# Patient Record
Sex: Male | Born: 1955 | Race: Black or African American | Hispanic: No | Marital: Married | Smoking: Never smoker
Health system: Southern US, Community
[De-identification: ages and names within clinical notes are randomized; demographics above are authoritative.]

## PROBLEM LIST (undated history)

## (undated) DIAGNOSIS — T7840XA Allergy, unspecified, initial encounter: Secondary | ICD-10-CM

## (undated) DIAGNOSIS — K222 Esophageal obstruction: Secondary | ICD-10-CM

## (undated) DIAGNOSIS — I10 Essential (primary) hypertension: Secondary | ICD-10-CM

## (undated) DIAGNOSIS — K922 Gastrointestinal hemorrhage, unspecified: Secondary | ICD-10-CM

## (undated) HISTORY — DX: Allergy, unspecified, initial encounter: T78.40XA

---

## 1981-05-22 DIAGNOSIS — K222 Esophageal obstruction: Secondary | ICD-10-CM | POA: Insufficient documentation

## 2000-05-25 ENCOUNTER — Ambulatory Visit (HOSPITAL_COMMUNITY): Admission: RE | Admit: 2000-05-25 | Discharge: 2000-05-25 | Payer: Self-pay | Admitting: Gastroenterology

## 2000-05-25 ENCOUNTER — Encounter: Payer: Self-pay | Admitting: Gastroenterology

## 2002-08-22 ENCOUNTER — Encounter: Payer: Self-pay | Admitting: Gastroenterology

## 2002-08-22 ENCOUNTER — Ambulatory Visit (HOSPITAL_COMMUNITY): Admission: RE | Admit: 2002-08-22 | Discharge: 2002-08-22 | Payer: Self-pay | Admitting: Gastroenterology

## 2003-06-25 ENCOUNTER — Ambulatory Visit (HOSPITAL_COMMUNITY): Admission: RE | Admit: 2003-06-25 | Discharge: 2003-06-25 | Payer: Self-pay | Admitting: Gastroenterology

## 2003-06-25 ENCOUNTER — Encounter (INDEPENDENT_AMBULATORY_CARE_PROVIDER_SITE_OTHER): Payer: Self-pay | Admitting: Specialist

## 2005-07-26 ENCOUNTER — Ambulatory Visit (HOSPITAL_COMMUNITY): Admission: RE | Admit: 2005-07-26 | Discharge: 2005-07-26 | Payer: Self-pay | Admitting: Gastroenterology

## 2007-04-17 ENCOUNTER — Encounter (INDEPENDENT_AMBULATORY_CARE_PROVIDER_SITE_OTHER): Payer: Self-pay | Admitting: Gastroenterology

## 2007-04-17 ENCOUNTER — Ambulatory Visit (HOSPITAL_COMMUNITY): Admission: RE | Admit: 2007-04-17 | Discharge: 2007-04-17 | Payer: Self-pay | Admitting: Gastroenterology

## 2008-07-01 ENCOUNTER — Ambulatory Visit (HOSPITAL_COMMUNITY): Admission: RE | Admit: 2008-07-01 | Discharge: 2008-07-01 | Payer: Self-pay | Admitting: Gastroenterology

## 2010-10-04 NOTE — Op Note (Signed)
NAME:  Kenneth Maynard, Kenneth Maynard            ACCOUNT NO.:  [id number]   MEDICAL RECORD NO.:  [id number]          PATIENT TYPE:  AMB   LOCATION:  ENDO                         FACILITY:  [facility]   PHYSICIAN:  Danise Edge, M.D.   DATE OF BIRTH:  Dec 30, 1955   DATE OF PROCEDURE:  07/01/2008  DATE OF DISCHARGE:                               OPERATIVE REPORT   PROCEDURES:  Esophagogastroduodenoscopy, Savary esophageal dilation, and  surveillance colonoscopy.   INDICATIONS FOR PROCEDURE:  [name] is a 55 year old male  born on Oct 13, 1955.  Kenneth Maynard has an unexplained cervical  esophageal stricture which has required multiple esophageal dilations in  the past.  Approximately 5 years ago, he underwent his first screening  colonoscopy and a 2-mm adenomatous polyp was removed from his colon.  His father was diagnosed with colon cancer at age 33.   MEDICATION ALLERGIES:  None.   CHRONIC MEDICATIONS:  1. Lisinopril.  2. Hydrochlorothiazide.  3. Aspirin 81 mg.   PAST MEDICAL AND SURGICAL HISTORY:  Uncomplicated hypertension.  Cervical esophageal stricture.   FAMILY HISTORY:  Father diagnosed with colon cancer at age 15.   HABITS:  [name] does not smoke cigarettes and consumes alcohol in  moderation.   PROCEDURE #1:  Esophagogastroduodenoscopy with Savary esophageal  dilation.  After obtaining informed consent, Kenneth Maynard was placed in  the left lateral decubitus position on the fluoroscopy table.  I  administered a total of fentanyl 125 mcg and Versed 12.5 mg for his  esophagogastroduodenoscopy and colonoscopy.  The patient's blood  pressure, oxygen saturation, and cardiac rhythm were monitored  throughout the procedure and documented in the medical record.   A Pentax gastroscope was passed into the cervical esophagus through a  normal-appearing hypopharynx.  I had a great deal of difficulty locating  the entrance to the esophagus through a very very tight cervical  esophageal stricture.  I could only get the guidewire through the  stricture and under fluoroscopic guidance, the Savary esophageal  guidewire was passed down the esophagus into the stomach and into the  distal gastric antrum.  Under fluoroscopic guidance, the 10-mm, 11-mm,  and 12-mm Savary dilators were passed with minimal resistance.  The  guidewire was removed.  The Pentax gastroscope was again passed through  the posterior hypopharynx into the proximal esophagus and through the  cervical esophageal stricture without difficulty.  The esophageal  stricture appeared to be adequately dilated and I did not plan to pass  any further dilators given the tightness of the cervical esophageal  stricture.  The mucosa in the mid to distal esophagus appeared normal.  At the esophagogastric junction was a dilated Schatzki ring.  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  There is no endoscopic evidence for the  presence of erosive esophagitis or Barrett esophagus.   GASTROSCOPY:  Retroflex view of the gastric cardia and fundus was  normal.  The gastric body, antrum, and pylorus appeared normal.   DUODENOSCOPY:  The duodenal bulb and descending duodenum appeared  normal.  Two years ago, biopsies were obtained from the esophagus which did not  reveal eosinophilic esophagitis.   ASSESSMENT:  Cervical esophageal stricture and Schatzki ring at the  esophagogastric junction dilated with a 10-mm, 11-mm, and 12-mm Savary  dilators.  Otherwise, esophagogastroduodenoscopy was normal except for  an unchanged subepithelial tumor in the distal esophagus.   PROCEDURE #2:  Surveillance colonoscopy.  Anal inspection and digital  rectal exam were normal.  The Pentax colonoscope was introduced into the  rectum and advanced to the cecum.  A normal-appearing ileocecal valve  and appendiceal orifice were identified.  Colonic preparation for the   exam today was good.   Rectum:  Normal.  Sigmoid colon and descending colon:  Normal.  Splenic flexure:  Normal.  Transverse colon:  Normal.  Hepatic flexure:  Normal.  Ascending colon:  Normal.  Cecum and ileocecal valve:  Normal.   ASSESSMENT:  Normal surveillance proctocolonoscopy to the cecum.   RECOMMENDATIONS:  Repeat colonoscopy in 5 years.           ______________________________  Danise Edge, M.D.     MJ/MEDQ  D:  07/01/2008  T:  07/01/2008  Job:  [id number]   cc:   Brett Canales A. Cleta Alberts, M.D.

## 2010-10-04 NOTE — Op Note (Signed)
NAME:  Kenneth Maynard, Kenneth Maynard            ACCOUNT NO.:  [id number]   MEDICAL RECORD NO.:  [id number]          PATIENT TYPE:  AMB   LOCATION:  ENDO                         FACILITY:  [facility]   PHYSICIAN:  Danise Edge, M.D.   DATE OF BIRTH:  1955-07-26   DATE OF PROCEDURE:  04/17/2007  DATE OF DISCHARGE:                               OPERATIVE REPORT   PROCEDURE:  Esophagogastroduodenoscopy, Savary esophageal dilation  report.   PROCEDURE INDICATIONS:  Kenneth Maynard is a 55 year old male born  January 18, 1956.  Kenneth Maynard was diagnosed with an unexplained cervical  esophageal stricture years ago by Dr. Kaylyn Lim at [facility].  Kenneth Maynard has required regular esophageal dilations to  manage his recurrent dysphagia.  The etiology of the cervical esophageal  stricture has not been established.  In addition Kenneth Maynard has a small  subepithelial lesion at the esophagogastric junction which has not  changed in size for the past 5 years that I have been following [name].   Kenneth Maynard has redeveloped esophageal dysphagia.   ENDOSCOPIST:  Danise Edge, M.D.   PREMEDICATION:  Fentanyl 75 mcg, Versed 10 mg.   PROCEDURE:  After obtaining informed consent Kenneth Maynard was placed in  the left lateral decubitus position.  I administered intravenous  fentanyl and intravenous Versed to achieve conscious sedation for the  procedure.  The patient's blood pressure, oxygen saturation and cardiac  rhythm were monitored throughout the procedure and documented in the  medical record.   The Pentax gastroscope was passed through the posterior hypopharynx into  the proximal esophagus.  I could identify a very tight stricture at  approximately 18 cm from the incisor teeth but could not pass the Pentax  gastroscope through the stricture.  The Savary dilator wire was passed  through the gastroscope and I could only advance the wire to the distal  esophagus but not into the  stomach.  The gastroscope was removed.   The 10-mm Savary dilator was passed under fluoroscopic guidance.  The  guidewire was removed.  The Pentax gastroscope was again passed through  the posterior hypopharynx and the proximal esophagus revealing a  partially dilated cervical esophageal stricture at 18 cm from the  incisor teeth.  I was able to traverse the stricture with the  gastroscope.  The remainder of the esophageal mucosa appears normal.  The subepithelial lesion at the esophagogastric junction measures  approximately 1 cm and has not changed in size over the past 6 years.  There is no endoscopic evidence for the presence of erosive esophagitis  or Barrett's esophagus.   GASTROSCOPY:  Retroflexed view of the gastric cardia and fundus was  normal.  The gastric body, antrum and pylorus appeared normal.   DUODENOSCOPY:  The duodenal bulb and descending duodenum appear normal.   SAVARY ESOPHAGEAL DILATION:  The Savary dilator wire was passed through  the gastroscope and the tip of the guidewire advanced to the distal  gastric antrum as confirmed endoscopically and fluoroscopically.  Under  fluoroscopic guidance the 12 mm, 14 mm, and 15-mm Savary dilators were  passed without resistance.  Repeat esophagogastrostomy confirmed  satisfactory dilation of the cervical esophageal stricture at  approximately 18 cm from the incisor teeth and no gastric trauma due to  the guidewire.   BIOPSIES:  Multiple biopsies were obtained along the length of the  esophagus to rule out eosinophilic esophagitis.   ASSESSMENT:  1. Tight cervical esophageal stricture at 18 cm from the incisor teeth      dilated with the 10 mm, 12 mm, 14 mm, and 15-mm Savary dilators.  2. Esophageal biopsies performed to rule out eosinophilic esophagitis.  3. Chronic subepithelial lesion at the esophagogastric junction      measuring approximately 1 cm in size which has not changed in the      last 6 years.            ______________________________  Danise Edge, M.D.     MJ/MEDQ  D:  04/17/2007  T:  04/17/2007  Job:  [id number]   cc:   Brett Canales A. Cleta Alberts, M.D.

## 2010-10-07 NOTE — Op Note (Signed)
NAME:  Kenneth, Maynard NO.:  [id number]   MEDICAL RECORD NO.:  [id number]          PATIENT TYPE:  AMB   LOCATION:  ENDO                         FACILITY:  [facility]   PHYSICIAN:  Danise Edge, M.D.   DATE OF BIRTH:  05/20/1956   DATE OF PROCEDURE:  07/26/2005  DATE OF DISCHARGE:                                 OPERATIVE REPORT   PROCEDURE INDICATIONS:  [name] is a 55 year old male who has a  chronic benign cervical esophageal stricture of undetermined etiology which  has required numerous esophageal dilations to manage his dysphagia. 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 His father was diagnosed with  colon cancer at age 91. On June 25, 2003, [name] underwent a  screening colonoscopy and a 2-mm sessile adenomatous polyp was removed from  his splenic flexure. He is due for a repeat colonoscopy in February 2010.   ENDOSCOPIST:  Danise Edge, M.D.   PREMEDICATION:  Versed 7.5 milligrams, Demerol 50 milligrams.   PROCEDURE:  After obtaining informed consent Mr. Pile was placed in the  left lateral decubitus position on the fluoroscopy table. I administered  intravenous Demerol and intravenous Versed to achieve conscious sedation for  the procedure. The patient's blood pressure, oxygen saturation and cardiac  rhythm were monitored throughout the procedure and documented in the medical  record.   The Olympus gastroscope was passed through the posterior hypopharynx into  the proximal esophagus at which point the cervical esophageal stricture did  not allow passage of the gastroscope distally. Under fluoroscopic guidance,  the Savary dilator wire was passed but I could only place the guidewire in  the distal esophagus as it would not traverse the esophagogastric junction  and enter the stomach. The gastroscope was removed. The 10-mm Savary dilator  was  passed under fluoroscopic guidance. The dilator wire was removed. The  Olympus gastroscope was passed through the posterior hypopharynx into the  proximal esophagus without difficulty. The hypopharynx, larynx and vocal  cords appeared normal.   Esophagoscopy: The cervical esophageal stricture was dilated and allowed  passage of the gastroscope down the esophagus to the esophagogastric  junction. The mid and distal esophagus appeared normal except for the  presence of a 1 cm submucosal polypoid tumor at the esophagogastric junction  with normal overlying esophageal mucosa. This lesion has been monitored over  the five years and has not changed.   Gastroscopy: Retroflex view of the gastric cardia and fundus was normal. The  gastric body, antrum and pylorus appeared normal.   Duodenoscopy: The duodenal bulb and descending duodenum appeared normal.   Savary esophageal dilation: The Savary dilator wire was passed through the  gastroscope and the tip of the guidewire advanced to the distal gastric  antrum as confirmed endoscopically and fluoroscopically. Under fluoroscopic  guidance the 12-mm and 14-mm Savary dilators were passed without resistance.  The guidewire was then removed and repeat esophagogastrostomy confirmed  satisfactory dilation of the benign stricture in the cervical esophagus and  no gastric trauma due to the guidewire.  ASSESSMENT:  1.  Benign cervical esophageal stricture dilated with the 10-mm, 12-mm, and      14-mm Savary dilators.  2.  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           ______________________________  Danise Edge, M.D.     MJ/MEDQ  D:  07/26/2005  T:  07/27/2005  Job:  [id number]

## 2010-10-07 NOTE — Procedures (Signed)
[facility]  Patient:    Kenneth Maynard, Kenneth Maynard                     MRN: [id number] Proc. Date: 05/25/00 Attending:  Verlin Maynard, M.D. CC:         Kenneth Maynard. Kenneth Maynard, M.D.  Kenneth C. Kenneth Maynard, M.D.   Procedure Report  REFERRING PHYSICIAN:  Viviann Spare A. Kenneth Maynard, M.D.  PROCEDURE INDICATIONS:  Kenneth Maynard has a chronic cervical esophageal stricture associated with dysphagia.  Approximately six years ago, he was evaluated at [facility] and underwent his first and only esophageal dilation performed by Kenneth Maynard, M.D.  Kenneth Maynard underwent a barium swallow x-ray at the [facility] facility on April 10, 2000, which revealed a proximal esophageal stricture.  Kenneth Maynard viewed our esophagogastroduodenoscopy education film.  I discussed with him the complications associated with esophageal dilation, including intestinal bleeding and intestinal perforation.  Kenneth Maynard has signed the operative permit.  ENDOSCOPIST:  Kenneth Maynard, M.D.  PREMEDICATION:  Demerol 100 mg, Versed 10 mg.  ENDOSCOPE:  Olympus gastroscope.  DESCRIPTION OF PROCEDURE:  After obtaining informed consent, the patient was placed in the left lateral decubitus position on the fluoroscopy table.  I administered intravenous Demerol and intravenous Versed to achieve conscious sedation for the procedure.  The patients blood pressure, oxygen saturation, and cardiac rhythm were monitored throughout the procedure and documented in the medical record.  The Olympus gastroscope was passed through the posterior hypopharynx into the proximal esophagus.  The hypopharynx, larynx, and vocal cords appeared normal.  Esophagoscopy:  There is a very tight cervical esophageal stricture.  I could not traverse the stricture with the 9 mm diameter gastroscope.  The Savary dilator wire was passed through the endoscope and under fluoroscopic guidance, the tip of  the guide wire was advanced into the proximal gastric body.  The 9 mm, 10 mm, 11 mm, and 12 mm Savary dilators were passed under fluoroscopic guidance and without resistance.  The guide wire was removed and the gastroscope was passed through the posterior hypopharynx into the proximal esophagus without difficulty.  I was able to traverse the proximal esophageal stricture and the mid and distal esophagus appeared normal.  There does appear to be a 1 cm submucosal, benign-appearing polyp in the distal esophagus, which is not obstructing the esophagus.  Gastroscopy:  Retroflexed view of the gastric cardia and fundus was normal. The gastric body, antrum, and pylorus appeared normal.  Duodenoscopy:  The duodenal bulb and descending duodenum appeared normal.  The Savary dilator wire was again passed through the endoscope and the tip of the guide wire advanced to the distal gastric antrum as confirmed both endoscopically and fluoroscopically.  Under fluoroscopic guidance the 12.8 mm and 14 mm Savary dilators passed with minimal resistance.  Repeat esophagogastroscopy confirmed satisfactory dilation of the apparent cervical esophageal stricture and no evidence of gastric trauma due to the guide wire.  ASSESSMENT:  Very high cervical esophageal stricture with an otherwise normal-appearing esophagogastroduodenoscopy, except for the presence of a 1 cm submucosal polyp in the distal esophagus with normal overlying gastric mucosa. The cervical esophageal stricture was dilated with the 9 mm, 10 mm, 11 mm, 12 mm, 12.8 mm, and 14 mm Savary dilators.  I am referring Kenneth Maynard to the radiology suite for a barium swallow x-ray to confirm that I did not cause an esophageal perforation with the procedure and to better define the cervical esophageal  stricture, which will probably require dilations in the future.  I have written him a prescription for Percocet 10/650 mg as an elixir (30 doses). DD:   05/25/00 TD:  05/25/00 Job: [id number]

## 2010-10-07 NOTE — Op Note (Signed)
NAME:  Kenneth Maynard, Kenneth Maynard                        ACCOUNT NO.:  [id number]   MEDICAL RECORD NO.:  [id number]                   PATIENT TYPE:  AMB   LOCATION:  ENDO                                 FACILITY:  [facility]   PHYSICIAN:  Danise Edge, M.D.                DATE OF BIRTH:  07/14/1955   DATE OF PROCEDURE:  08/22/2002  DATE OF DISCHARGE:  08/22/2002                                 OPERATIVE REPORT   PROCEDURE:  Esophageal dilation.   REFERRING PHYSICIAN:  Stan Head. Cleta Alberts, M.D.   INDICATIONS:  The patient is a 55 year old male born 08-24-2055.  The patient  has a chronic cervical esophageal stricture.  Approximately six years ago he  was evaluated at the [facility] and underwent an  esophageal dilation performed by Dr. Kaylyn Lim.   On 04/10/00, the patient underwent a barium swallow x-ray at [facility], which revealed a proximal esophageal stricture.   ENDOSCOPIST:  Danise Edge, M.D.   PREMEDICATION:  Demerol 100 mg, Versed 10 mg.   DESCRIPTION OF PROCEDURE:  After obtaining informed consent, the patient was  placed in the left lateral decubitus position on the fluoroscopy table.  I  administered intravenous Demerol and intravenous Versed to achieve conscious  sedation for the procedure.  The patient's, blood pressure, oxygen  saturation, and cardiac rhythm were monitored throughout the procedure and  documented in the medical record.   The Olympus gastroscope was passed through the posterior hypopharynx and  into the proximal esophagus, at which point a stricture was encountered.  Narrowing of the esophageal lumen was too tight to allow passage of the  endoscope.  The guidewire was passed through the endoscope and under  fluoroscopic guidance down the esophagus and into the distal stomach.  The  10 mm, 11 mm, and 12 mm Savary dilators were passed under fluoroscopic  guidance with minimal resistance.  The guidewire was removed.  The  Olympus  gastroscope was passed through the posterior hypopharynx into the proximal  esophagus without difficulty.  The hypopharynx, larynx, and vocal cords  appeared normal.   Esophagoscopy:  There is a thin-rimmed dilated proximal esophageal  stricture, which offered no resistance to the passage of the gastroscope  post dilation.  The mucosa in the proximal, mid-, and lower segments of the  esophagus appear normal.  The squamocolumnar junction and esophagogastric  junction are noted at 40 cm from the incisor teeth.  There is a 7-10 mm  submucosal lesion with overlying intact esophageal mucosa at the  esophagogastric junction.   Gastroscopy:  Retroflexed view of the gastric cardia and fundus was normal.  The gastric body, antrum, and pylorus appear normal.   Duodenoscopy:  The duodenal bulb and descending duodenum appear normal.    ASSESSMENT:  1. Cervical esophageal stricture, dilated with the 10 mm, 11 mm, and 12 mm     Savary dilators.  2. Submucosal lesion (  7-10 mm diameter) at the esophagogastric junction with     normal overlying esophageal mucosa.                                               Danise Edge, M.D.    MJ/MEDQ  D:  08/22/2002  T:  08/24/2002  Job:  [id number]   cc:   Brett Canales A. Cleta Alberts, M.D.  [address]  [place]  [place] [address]  Fax: [phone]

## 2010-10-07 NOTE — Op Note (Signed)
NAME:  Kenneth Maynard, APO                        ACCOUNT NO.:  [id number]   MEDICAL RECORD NO.:  [id number]                   PATIENT TYPE:  AMB   LOCATION:  ENDO                                 [facility]ACILITY:  [facility]   PHYSICIAN:  Danise Edge, M.D.                DATE OF BIRTH:  1955/10/20   DATE OF PROCEDURE:  06/25/2003  DATE OF DISCHARGE:                                 OPERATIVE REPORT   PROCEDURE:  Esophagogastroduodenoscopy, Savary esophageal dilation,  colonoscopy, and polypectomy report.   INDICATIONS:  Mr. Page Lancon is a 55 year old male with a chronic  cervical esophageal stricture, etiology undetermined, which has required  numerous esophageal dilations.  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  His father was diagnosed  with colon cancer at age 67.  Mr. Marques Ericson needs colorectal polyp  screening to prevent colon cancer.   ENDOSCOPIST:  Danise Edge, M.D.   PREMEDICATION:  Versed 10 mg, Demerol 100 mg.   DESCRIPTION OF PROCEDURE:  Esophageal dilation:  After obtaining informed  consent, Mr. Jaquith was placed on the left lateral decubitus position on the  fluoroscopy table.  I administered intravenous Demerol and intravenous  Versed to achieve conscious sedation for the procedure.  The patient's blood  pressure, oxygen saturation and cardiac rhythm were monitored throughout the  procedure and documented in the medical record.   The Olympus gastroscope was passed through the posterior hypopharynx into  the proximal esophagus but could not be advanced down the length of the  esophagus due to the presence of the cervical esophageal stricture.  The  Savary dilator wire was passed through the endoscope and down to distal  esophagus under fluoroscopic guidance.  In the distal esophagus, the Savary  dilator wire curled and could not be advanced into the stomach.  The 9 mm,  10 mm and 11 mm Savary dilators were  passed under fluoroscopic guidance to  the distal esophagus.  The Savary dilator wire was removed.  The Olympus  gastroscope was passed through the posterior hypopharynx and into the  proximal esophagus without difficulty.  The hypopharynx, larynx and vocal  cords appeared normal.   Esophagoscopy:  There is a partially dilated stricture in the cervical  esophagus which is benign.  The submucosal tumor is noted just proximal to  the esophagogastric junction.  There appears to be a shallow Schatzki's ring  at the esophagogastric junction.  The mucosa of the esophagus is normal.   Gastroscopy:  Retroflexed view of the gastrocardia and fundus reveals a  patulous diaphragmatic hiatus with normal gastric cardia and fundus.  The  gastric body, antrum and pylorus appeared normal.   Duodenoscopy:  The duodenal bulb and descending duodenum appeared normal.   ASSESSMENT:  1. Cervical esophageal stricture dilated with the 9 mm, 10 mm and 11 mm     Savary dilators.  2. Chronically unchanging submucosal polypoid  tumor in the distal esophagus     with overlying normal esophageal mucosa.  3. Patulous diaphragmatic hiatus with shallow Schatzki's ring at the     esophagogastric junction.   PROCEDURE:  Colonoscopy with polypectomy.   Anal inspection was normal.  Digital rectal exam was normal.  The Olympus  adjustable pediatric  colonoscope was introduced into the rectum and  advanced to the cecum.  Colonic preparation for the exam today was  excellent.  Rectum:  Normal.  Sigmoid colon and descending colon:  Normal.  Splenic flexure:  A 2 mm sessile polyp was removed from the splenic flexure  with the electrocautery snare and submitted for pathological interpretation.  Transverse colon:  Normal.  Hepatic flexure:  Normal.  Ascending colon:  Normal.  Cecum and ileocecal valve:  Normal.   ASSESSMENT:  1. Positive family history for colon cancer in his father diagnosed at age     43.  2. Small  polyp removed from the splenic flexure with the electrocautery     snare.   RECOMMENDATIONS:  Repeat colonoscopy in five years.                                               Danise Edge, M.D.    MJ/MEDQ  D:  06/25/2003  T:  06/25/2003  Job:  [id number]

## 2011-04-24 ENCOUNTER — Encounter: Payer: Self-pay | Admitting: Physician Assistant

## 2011-05-04 ENCOUNTER — Ambulatory Visit (INDEPENDENT_AMBULATORY_CARE_PROVIDER_SITE_OTHER): Payer: 59

## 2011-05-04 DIAGNOSIS — R937 Abnormal findings on diagnostic imaging of other parts of musculoskeletal system: Secondary | ICD-10-CM

## 2011-05-04 DIAGNOSIS — J301 Allergic rhinitis due to pollen: Secondary | ICD-10-CM

## 2011-06-22 ENCOUNTER — Telehealth: Payer: Self-pay | Admitting: Physician Assistant

## 2011-06-22 DIAGNOSIS — G4700 Insomnia, unspecified: Secondary | ICD-10-CM

## 2011-06-22 MED ORDER — CLONAZEPAM 0.5 MG PO TABS: 0.5000 mg | ORAL_TABLET | Freq: Every evening | ORAL | Status: DC | PRN

## 2011-06-22 NOTE — Telephone Encounter (Signed)
Fax request from pharmacy for Clonazepam. Please call in. csj

## 2011-06-22 NOTE — Telephone Encounter (Signed)
Clonazapam called in and left on VM.

## 2011-06-25 ENCOUNTER — Encounter: Payer: Self-pay | Admitting: Physician Assistant

## 2011-06-25 NOTE — Telephone Encounter (Signed)
Error

## 2011-09-09 ENCOUNTER — Telehealth: Payer: Self-pay | Admitting: Physician Assistant

## 2011-09-09 MED ORDER — CLONAZEPAM 0.5 MG PO TABS: 0.5000 mg | ORAL_TABLET | Freq: Every evening | ORAL | Status: DC | PRN

## 2011-09-09 NOTE — Telephone Encounter (Signed)
Rx printed

## 2011-09-19 ENCOUNTER — Telehealth: Payer: Self-pay

## 2011-09-19 NOTE — Telephone Encounter (Signed)
[facility] REQUESTS PTS LAST CPE FAXED TO THEM. PT HAS APPT 10/23/11.   [facility] PH: [phone] [facility] FAX: [phone]  BF

## 2011-10-19 ENCOUNTER — Encounter (HOSPITAL_COMMUNITY)
Admission: RE | Disposition: A | Discharge status: Home / Self Care | Payer: Self-pay | Source: Ambulatory Visit | Attending: Gastroenterology

## 2011-10-19 ENCOUNTER — Encounter (HOSPITAL_COMMUNITY): Payer: Self-pay

## 2011-10-19 ENCOUNTER — Ambulatory Visit (HOSPITAL_COMMUNITY)
Admission: RE | Admit: 2011-10-19 | Discharge: 2011-10-19 | Disposition: A | Discharge status: Home / Self Care | Payer: 59 | Source: Ambulatory Visit | Attending: Gastroenterology | Admitting: Gastroenterology

## 2011-10-19 ENCOUNTER — Ambulatory Visit (HOSPITAL_COMMUNITY): Payer: 59

## 2011-10-19 DIAGNOSIS — Z800 Family history of malignant neoplasm of digestive organs: Secondary | ICD-10-CM | POA: Insufficient documentation

## 2011-10-19 DIAGNOSIS — Z860100 Personal history of colon polyps, unspecified: Secondary | ICD-10-CM | POA: Insufficient documentation

## 2011-10-19 DIAGNOSIS — K222 Esophageal obstruction: Secondary | ICD-10-CM | POA: Insufficient documentation

## 2011-10-19 HISTORY — PX: SAVORY DILATION: SHX5439

## 2011-10-19 HISTORY — PX: ESOPHAGOGASTRODUODENOSCOPY: SHX5428

## 2011-10-19 HISTORY — DX: Essential (primary) hypertension: I10

## 2011-10-19 SURGERY — EGD (ESOPHAGOGASTRODUODENOSCOPY)
Anesthesia: Moderate Sedation

## 2011-10-19 MED ORDER — DIPHENHYDRAMINE HCL 50 MG/ML IJ SOLN
INTRAMUSCULAR | Status: AC
  Filled 2011-10-19: qty 1

## 2011-10-19 MED ORDER — MIDAZOLAM HCL 10 MG/2ML IJ SOLN
INTRAMUSCULAR | Status: AC
  Filled 2011-10-19: qty 4

## 2011-10-19 MED ORDER — FENTANYL NICU IV SYRINGE 50 MCG/ML
INJECTION | INTRAMUSCULAR | Status: DC | PRN
  Administered 2011-10-19 (×2): 25 ug via INTRAVENOUS
  Administered 2011-10-19: 50 ug via INTRAVENOUS

## 2011-10-19 MED ORDER — MIDAZOLAM HCL 10 MG/2ML IJ SOLN
INTRAMUSCULAR | Status: DC | PRN
  Administered 2011-10-19 (×4): 2.5 mg via INTRAVENOUS

## 2011-10-19 MED ORDER — BUTAMBEN-TETRACAINE-BENZOCAINE 2-2-14 % EX AERO
INHALATION_SPRAY | CUTANEOUS | Status: DC | PRN
  Administered 2011-10-19: 2 via TOPICAL

## 2011-10-19 MED ORDER — FENTANYL CITRATE 0.05 MG/ML IJ SOLN
INTRAMUSCULAR | Status: AC
  Filled 2011-10-19: qty 4

## 2011-10-19 MED ORDER — SODIUM CHLORIDE 0.9 % IV SOLN
Freq: Once | INTRAVENOUS | Status: AC
  Administered 2011-10-19: 500 mL via INTRAVENOUS

## 2011-10-19 NOTE — H&P (Signed)
Problem: Dysphagia associated with a cervical esophageal stricture.  History: The patient is a 56 year old male with a chronic cervical esophageal stricture which has required esophageal dilations in the past. Esophageal biopsies did not show eosinophilic esophagitis. The patient is scheduled to undergo a diagnostic esophagogastroduodenoscopy with dilation of his esophageal stricture.  Chronic medications: None  Medication allergies: None  Past medical and surgical history: Benign cervical esophageal stricture of undetermined etiology. History of adenomatous colon polyps. Normal surveillance colonoscopy February 2010.  Family history: Father diagnosed with colon cancer at age  Habits: The patient has never smoked cigarettes and does not consume alcohol.  Plan: Proceed with diagnostic esophagogastroduodenoscopy and dilation of the patient's cervical esophageal stricture.

## 2011-10-19 NOTE — Discharge Instructions (Signed)
Endoscopy Care After Please read the instructions outlined below and refer to this sheet in the next few weeks. These discharge instructions provide you with general information on caring for yourself after you leave the hospital. Your doctor may also give you specific instructions. While your treatment has been planned according to the most current medical practices available, unavoidable complications occasionally occur. If you have any problems or questions after discharge, please call your doctor. HOME CARE INSTRUCTIONS Activity  You may resume your regular activity but move at a slower pace for the next 24 hours.   Take frequent rest periods for the next 24 hours.   Walking will help expel (get rid of) the air and reduce the bloated feeling in your abdomen.   No driving for 24 hours (because of the anesthesia (medicine) used during the test).   You may shower.   Do not sign any important legal documents or operate any machinery for 24 hours (because of the anesthesia used during the test).  Nutrition  Drink plenty of fluids.   You may resume your normal diet.   Begin with a light meal and progress to your normal diet.   Avoid alcoholic beverages for 24 hours or as instructed by your caregiver.  Medications You may resume your normal medications unless your caregiver tells you otherwise. What you can expect today  You may experience abdominal discomfort such as a feeling of fullness or "gas" pains.   You may experience a sore throat for 2 to 3 days. This is normal. Gargling with salt water may help this.  Follow-up Your doctor will discuss the results of your test with you. SEEK IMMEDIATE MEDICAL CARE IF:  You have excessive nausea (feeling sick to your stomach) and/or vomiting.   You have severe abdominal pain and distention (swelling).   You have trouble swallowing.   You have a temperature over 100 F (37.8 C).   You have rectal bleeding or vomiting of blood.    Document Released: 12/21/2003 Document Revised: 04/27/2011 Document Reviewed: 07/03/2007 [employer] Patient Information 2012 [employer]. 

## 2011-10-19 NOTE — Op Note (Signed)
Procedure: Diagnostic esophagogastroduodenoscopy. Savary dilation of a benign cervical esophageal stricture. The balloon dilation of a benign esophageal stricture at the esophagogastric junction.  Endoscopist: Danise Edge  Premedication: Versed 10 mg. Fentanyl 100 mcg.  Procedure: The patient was placed in the left lateral decubitus position. The Pentax gastroscope was passed through the posterior hypopharynx into the proximal esophagus. At approximately 20 cm from the incisor teeth there is a benign cervical esophageal stricture. I was unable to traverse the stricture with the gastroscope. A Savary dilator wire was passed through the gastroscope and under fluoroscopic guidance, the tip of the guidewire was advanced to the distal gastric antrum. Under fluoroscopic guidance, the 10 mm, 11 mm, and 12 mm Savary dilators were passed without resistance. The Savary guidewire was removed. The Pentax gastroscope was passed through the posterior hypopharynx into the proximal esophagus without difficulty. The cervical esophageal stricture was dilated and there was a mucosal tear in the area of the stricture. There was no significant bleeding. The mid esophageal mucosa appeared normal. There is a benign stricture at the esophagogastric junction noted at approximately 40 cm from the incisor teeth. Just above the benign stricture at the esophagogastric junction there is a 1 cm subepithelial tumor which has not changed in the past few years. Using the esophageal balloon dilator, the benign stricture at the esophagogastric junction was dilated to 15 cm. There is no endoscopic evidence for the presence of erosive esophagitis, Barrett's esophagus, or malignancy .Previous biopsies of the esophagus did not show eosinophilic esophagitis.  Gastroscopy: Retroflex view of the gastric cardia and fundus was normal. The gastric body, antrum, and pylorus appeared normal.  Duodenoscopy: The duodenal bulb and descending duodenum  appeared normal.  Assessment: Benign cervical esophageal stricture dilated to 12 mm with the Savary dilator. A benign stricture at the esophagogastric junction dilated to 15 cm using the balloon dilator. 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

## 2011-10-20 ENCOUNTER — Encounter (HOSPITAL_COMMUNITY): Payer: Self-pay | Admitting: Gastroenterology

## 2011-11-04 ENCOUNTER — Other Ambulatory Visit: Payer: Self-pay | Admitting: *Deleted

## 2011-11-04 MED ORDER — CLONAZEPAM 0.5 MG PO TABS: 0.5000 mg | ORAL_TABLET | Freq: Every evening | ORAL | Status: DC | PRN

## 2011-11-05 ENCOUNTER — Other Ambulatory Visit: Payer: Self-pay | Admitting: *Deleted

## 2011-11-05 MED ORDER — CLONAZEPAM 0.5 MG PO TABS: 0.5000 mg | ORAL_TABLET | Freq: Every evening | ORAL | Status: DC | PRN

## 2011-12-11 ENCOUNTER — Ambulatory Visit (INDEPENDENT_AMBULATORY_CARE_PROVIDER_SITE_OTHER): Payer: 59 | Admitting: Internal Medicine

## 2011-12-11 ENCOUNTER — Encounter: Payer: Self-pay | Admitting: Internal Medicine

## 2011-12-11 VITALS — BP 120/82 | HR 82 | Temp 98.1°F | Resp 16 | Ht 65.0 in | Wt 146.0 lb

## 2011-12-11 DIAGNOSIS — I10 Essential (primary) hypertension: Secondary | ICD-10-CM

## 2011-12-11 DIAGNOSIS — F419 Anxiety disorder, unspecified: Secondary | ICD-10-CM | POA: Insufficient documentation

## 2011-12-11 DIAGNOSIS — Z79899 Other long term (current) drug therapy: Secondary | ICD-10-CM

## 2011-12-11 DIAGNOSIS — G4700 Insomnia, unspecified: Secondary | ICD-10-CM

## 2011-12-11 LAB — BASIC METABOLIC PANEL
CO2: 29 mEq/L (ref 19–32)
Chloride: 104 mEq/L (ref 96–112)
Sodium: 141 mEq/L (ref 135–145)

## 2011-12-11 NOTE — Progress Notes (Signed)
  Subjective:    Patient ID: Kenneth Maynard, male    DOB: 1955/12/11, 56 y.o.   MRN: [id number]  HPI Doing well HTN controlled  Family situation improving, back together again.   Review of Systems unchanged    Objective:   Physical Exam normal       Assessment & Plan:  RF meds 1 yr.

## 2012-03-21 ENCOUNTER — Encounter: Payer: Self-pay | Admitting: *Deleted

## 2012-05-21 ENCOUNTER — Other Ambulatory Visit: Payer: Self-pay | Admitting: Physician Assistant

## 2013-01-06 ENCOUNTER — Other Ambulatory Visit: Payer: Self-pay | Admitting: Physician Assistant

## 2013-03-10 ENCOUNTER — Other Ambulatory Visit: Payer: Self-pay | Admitting: Physician Assistant

## 2013-03-28 ENCOUNTER — Telehealth: Payer: Self-pay

## 2013-03-28 MED ORDER — LISINOPRIL-HYDROCHLOROTHIAZIDE 10-12.5 MG PO TABS: 1.0000 | ORAL_TABLET | Freq: Every day | ORAL | Status: DC

## 2013-03-28 NOTE — Telephone Encounter (Signed)
Clinical - patient made an appt for a CPE on Dec. 19 but needs refills of his Lisinipril HCT to hold him until his appt. He uses [facility] on [address].  Patient # to call [phone] private line and he said you could leave a detailed mssg.

## 2013-03-28 NOTE — Telephone Encounter (Signed)
LMOM that RFs were sent in. 

## 2013-03-28 NOTE — Telephone Encounter (Signed)
Meds ordered this encounter  Medications  . lisinopril-hydrochlorothiazide (PRINZIDE,ZESTORETIC) 10-12.5 MG per tablet    Sig: Take 1 tablet by mouth daily. Needs office visit/labs 05/09/2013    Dispense:  30 tablet    Refill:  1    Order Specific Question:  Supervising Provider    Answer:  Merla Riches, ROBERT P [[id number]]

## 2013-05-09 ENCOUNTER — Encounter: Payer: Self-pay | Admitting: Family Medicine

## 2013-05-09 ENCOUNTER — Ambulatory Visit (INDEPENDENT_AMBULATORY_CARE_PROVIDER_SITE_OTHER): Payer: 59 | Admitting: Family Medicine

## 2013-05-09 VITALS — BP 108/72 | HR 86 | Temp 98.6°F | Resp 16 | Ht 64.75 in | Wt 148.0 lb

## 2013-05-09 DIAGNOSIS — Z0000 Encounter for general adult medical examination without abnormal findings: Secondary | ICD-10-CM

## 2013-05-09 DIAGNOSIS — G4700 Insomnia, unspecified: Secondary | ICD-10-CM

## 2013-05-09 DIAGNOSIS — I10 Essential (primary) hypertension: Secondary | ICD-10-CM

## 2013-05-09 DIAGNOSIS — Z23 Encounter for immunization: Secondary | ICD-10-CM

## 2013-05-09 LAB — CBC WITH DIFFERENTIAL/PLATELET
HCT: 42.2 % (ref 39.0–52.0)
Hemoglobin: 14.4 g/dL (ref 13.0–17.0)
Lymphs Abs: 1.1 10*3/uL (ref 0.7–4.0)
MCHC: 34.1 g/dL (ref 30.0–36.0)
Monocytes Absolute: 0.6 10*3/uL (ref 0.1–1.0)
Monocytes Relative: 12 % (ref 3–12)
Neutro Abs: 2.8 10*3/uL (ref 1.7–7.7)
Neutrophils Relative %: 63 % (ref 43–77)
RBC: 4.63 MIL/uL (ref 4.22–5.81)

## 2013-05-09 LAB — COMPREHENSIVE METABOLIC PANEL
AST: 16 U/L (ref 0–37)
Albumin: 4.2 g/dL (ref 3.5–5.2)
Alkaline Phosphatase: 62 U/L (ref 39–117)
BUN: 11 mg/dL (ref 6–23)
Calcium: 9.5 mg/dL (ref 8.4–10.5)
Chloride: 102 mEq/L (ref 96–112)
Creat: 0.97 mg/dL (ref 0.50–1.35)
Glucose, Bld: 84 mg/dL (ref 70–99)
Potassium: 4.1 mEq/L (ref 3.5–5.3)

## 2013-05-09 LAB — LIPID PANEL
HDL: 65 mg/dL (ref >39)
LDL Cholesterol: 127 mg/dL — ABNORMAL HIGH (ref 0–99)
Total CHOL/HDL Ratio: 3.2 Ratio
Triglycerides: 79 mg/dL (ref <150)

## 2013-05-09 MED ORDER — LISINOPRIL-HYDROCHLOROTHIAZIDE 10-12.5 MG PO TABS: 1.0000 | ORAL_TABLET | Freq: Every day | ORAL | Status: DC

## 2013-05-09 MED ORDER — CETIRIZINE HCL 10 MG PO TABS: 10.0000 mg | ORAL_TABLET | Freq: Every day | ORAL | Status: DC

## 2013-05-09 MED ORDER — CLONAZEPAM 0.5 MG PO TABS: 0.5000 mg | ORAL_TABLET | Freq: Every evening | ORAL | Status: DC | PRN

## 2013-05-09 MED ORDER — CLONAZEPAM 0.5 MG PO TABS: 0.5000 mg | ORAL_TABLET | Freq: Two times a day (BID) | ORAL | Status: DC | PRN
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
## 2013-05-09 NOTE — Patient Instructions (Addendum)
Potassium Content of Foods Potassium is a mineral found in many foods and drinks. It helps keep fluids and minerals balanced in your body and also affects how steadily your heart beats. The body needs potassium to control blood pressure and to keep the muscles and nervous system healthy. However, certain health conditions and medicine may require you to eat more or less potassium-rich foods and drinks. Your caregiver or dietitian will tell you how much potassium you should have each day. COMMON SERVING SIZES The list below tells you how big or small common portion sizes are:  1 oz.........4 stacked dice.  3 oz.........Deck of cards.  1 tsp........Tip of little finger.  1 tbsp......Thumb.  2 tbsp......Golf ball.   c...........Half of a fist.  1 c............A fist. FOODS AND DRINKS HIGH IN POTASSIUM More than 200 mg of potassium per serving. A serving size is  c (120 mL or noted gram weight) unless otherwise stated. While all the items on this list are high in potassium, some items are higher in potassium than others. Fruits  Apricots (sliced), 83 g.  Apricots (dried halves), 3 oz / 24 g.  Avocado (cubed),  c / 50 g.  Banana (sliced), 75 g.  Cantaloupe (cubed), 80 g.  Dates (pitted), 5 whole / 35 g.  Figs (dried), 4 whole / 32 g.  Guava, c / 55 g.  Honeydew, 1 wedge / 85 g.  Kiwi (sliced), 90 g.  Nectarine, 1 small / 129 g.  Orange, 1 medium / 131 g.  Orange juice.  Pomegranate seeds, 87 g.  Pomegranate juice.  Prunes (pitted), 3 whole / 30 g.  Prune juice, 3 oz / 90 mL.  Seedless raisins, 3 tbsp / 27 g. Vegetables  Artichoke,  of a medium / 64 g.  Asparagus (boiled), 90 g.  Baked beans,  c / 63 g.  Bamboo shoots,  c / 38 g.  Beets (cooked slices), 85 g.  Broccoli (boiled), 78 g.  Brussels sprout (boiled), 78 g.  Butternut squash (baked), 103 g.  Chickpea (cooked), 82 g.  Green peas (cooked), 80 g.  Hubbard squash (baked cubes),  c /  68 g.  Kidney beans (cooked), 5 tbsp / 55 g.  Lima beans (cooked),  c / 43 g.  Navy beans (cooked),  c / 61 g.  Potato (baked), 61 g.  Potato (boiled), 78 g.  Pumpkin (boiled), 123 g.  Refried beans,  c / 79 g.  Spinach (cooked),  c / 45 g.  Split peas (cooked),  c / 65 g.  Sun-dried tomatoes, 2 tbsp / 7 g.  Sweet potato (baked),  c / 50 g.  Tomato (chopped or sliced), 90 g.  Tomato juice.  Tomato paste, 4 tsp / 21 g.  Tomato sauce,  c / 61 g.  Vegetable juice.  White mushrooms (cooked), 78 g.  Yam (cooked or baked),  c / 34 g.  Zucchini squash (boiled), 90 g. Other Foods and Drinks  Almonds (whole),  c / 36 g.  Cashews (oil roasted),  c / 32 g.  Chocolate milk.  Chocolate pudding, 142 g.  Clams (steamed), 1.5 oz / 43 g.  Dark chocolate, 1.5 oz / 42 g.  Fish, 3 oz / 85 g.  King crab (steamed), 3 oz / 85 g.  Lobster (steamed), 4 oz / 113 g.  Milk (skim, 1%, 2%, whole), 1 c / 240 mL.  Milk chocolate, 2.3 oz / 66 g.  Milk shake.  Nonfat fruit   variety yogurt, 123 g.  Peanuts (oil roasted), 1 oz / 28 g.  Peanut butter, 2 tbsp / 32 g.  Pistachio nuts, 1 oz / 28 g.  Pumpkin seeds, 1 oz / 28 g.  Red meat (broiled, cooked, grilled), 3 oz / 85 g.  Scallops (steamed), 3 oz / 85 g.  Shredded wheat cereal (dry), 3 oblong biscuits / 75 g.  Spaghetti sauce,  c / 66 g.  Sunflower seeds (dry roasted), 1 oz / 28 g.  Veggie burger, 1 patty / 70 g. FOODS MODERATE IN POTASSIUM Between 150 mg and 200 mg per serving. A serving is  c (120 mL or noted gram weight) unless otherwise stated. Fruits  Grapefruit,  of the fruit / 123 g.  Grapefruit juice.  Pineapple juice.  Plums (sliced), 83 g.  Tangerine, 1 large / 120 g. Vegetables  Carrots (boiled), 78 g.  Carrots (sliced), 61 g.  Rhubarb (cooked with sugar), 120 g.  Rutabaga (cooked), 120 g.  Sweet corn (cooked), 75 g.  Yellow snap beans (cooked), 63 g. Other Foods and  Drinks   Bagel, 1 bagel / 98 g.  Chicken breast (roasted and chopped),  c / 70 g.  Chocolate ice cream / 66 g.  Pita bread, 1 large / 64 g.  Shrimp (steamed), 4 oz / 113 g.  Swiss cheese (diced), 70 g.  Vanilla ice cream, 66 g.  Vanilla pudding, 140 g. FOODS LOW IN POTASSIUM Less than 150 mg per serving. A serving size is  cup (120 mL or noted gram weight) unless otherwise stated. If you eat more than 1 serving of a food low in potassium, the food may be considered a food high in potassium. Fruits  Apple (slices), 55 g.  Apple juice.  Applesauce, 122 g.  Blackberries, 72 g.  Blueberries, 74 g.  Cranberries, 50 g.  Cranberry juice.  Fruit cocktail, 119 g.  Fruit punch.  Grapes, 46 g.  Grape juice.  Mandarin oranges (canned), 126 g.  Peach (slices), 77 g.  Pineapple (chunks), 83 g.  Raspberries, 62 g.  Red cherries (without pits), 78 g.  Strawberries (sliced), 83 g.  Watermelon (diced), 76 g. Vegetables  Alfalfa sprouts, 17 g.  Bell peppers (sliced), 46 g.  Cabbage (shredded), 35 g.  Cauliflower (boiled), 62 g.  Celery, 51 g.  Collard greens (boiled), 95 g.  Cucumber (sliced), 52 g.  Eggplant (cubed), 41 g.  Green beans (boiled), 63 g.  Lettuce (shredded), 1 c / 36 g.  Onions (sauteed), 44 g.  Radishes (sliced), 58 g.  Spaghetti squash, 51 g. Other Foods and Drinks  Angel food cake, 1 slice / 28 g.  Black tea.  Brown rice (cooked), 98 g.  Butter croissant, 1 medium / 57 g.  Carbonated soda.  Coffee.  Cheddar cheese (diced), 66 g.  Corn flake cereal (dry), 14 g.  Cottage cheese, 118 g.  Cream of rice cereal (cooked), 122 g.  Cream of wheat cereal (cooked), 126 g.  Crisped rice cereal (dry), 14 g.  Egg (boiled, fried, poached, omelet, scrambled), 1 large / 46 61 g.  English muffin, 1 muffin / 57 g.  Frozen ice pop, 1 pop / 55 g.  Graham cracker, 1 large rectangular cracker / 14 g.  Jelly beans, 112  g.  Non-dairy whipped topping.  Oatmeal, 88 g.  Orange sherbet, 74 g.  Puffed rice cereal (dry), 7 g.  Pasta (cooked), 70 g.  Rice cakes, 4 cakes / 36   g.  Sugared doughnut, 4 oz / 116 g.  White bread, 1 slice / 30 g.  White rice (cooked), 79 93 g.  Wild rice (cooked), 82 g.  Yellow cake, 1 slice / 68 g. Document Released: 12/20/2004 Document Revised: 04/24/2012 Document Reviewed: 09/22/2011 [facility] Patient Information 2014 [place], [place].   Keeping you healthy  Get these tests  Blood pressure- Have your blood pressure checked once a year by your healthcare provider.  Normal blood pressure is 120/80  Weight- Have your body mass index (BMI) calculated to screen for obesity.  BMI is a measure of body fat based on height and weight. You can also calculate your own BMI at [email or web address].  Cholesterol- Have your cholesterol checked every year.  Diabetes- Have your blood sugar checked regularly if you have high blood pressure, high cholesterol, have a family history of diabetes or if you are overweight.  Screening for Colon Cancer- Colonoscopy starting at age 32.  Screening may begin sooner depending on your family history and other health conditions. Follow up colonoscopy as directed by your Gastroenterologist.  Screening for Prostate Cancer- Both blood work (PSA) and a rectal exam help screen for Prostate Cancer.  Screening begins at age 20 with African-American men and at age 83 with Caucasian men.  Screening may begin sooner depending on your family history.  Take these medicines  Aspirin- One aspirin daily can help prevent Heart disease and Stroke.  Flu shot- Every fall.  Tetanus- Every 10 years.  Zostavax- Once after the age of 29 to prevent Shingles.  Pneumonia shot- Once after the age of 56; if you are younger than 33, ask your healthcare provider if you need a Pneumonia shot.  Take these steps  Don't smoke- If you do smoke, talk to your doctor  about quitting.  For tips on how to quit, go to www.smokefree.gov or call 1-800-QUIT-NOW.  Be physically active- Exercise 5 days a week for at least 30 minutes.  If you are not already physically active start slow and gradually work up to 30 minutes of moderate physical activity.  Examples of moderate activity include walking briskly, mowing the yard, dancing, swimming, bicycling, etc.  Eat a healthy diet- Eat a variety of healthy food such as fruits, vegetables, low fat milk, low fat cheese, yogurt, lean meant, poultry, fish, beans, tofu, etc. For more information go to www.thenutritionsource.org  Drink alcohol in moderation- Limit alcohol intake to less than two drinks a day. Never drink and drive.  Dentist- Brush and floss twice daily; visit your dentist twice a year.  Depression- Your emotional health is as important as your physical health. If you're feeling down, or losing interest in things you would normally enjoy please talk to your healthcare provider.  Eye exam- Visit your eye doctor every year.  Safe sex- If you may be exposed to a sexually transmitted infection, use a condom.  Seat belts- Seat belts can save your life; always wear one.  Smoke/Carbon Monoxide detectors- These detectors need to be installed on the appropriate level of your home.  Replace batteries at least once a year.  Skin cancer- When out in the sun, cover up and use sunscreen 15 SPF or higher.  Violence- If anyone is threatening you, please tell your healthcare provider.  Living Will/ Health care power of attorney- Speak with your healthcare provider and family.

## 2013-05-10 LAB — TSH: TSH: 1.164 u[IU]/mL (ref 0.350–4.500)

## 2013-05-10 LAB — PSA: PSA: 1 ng/mL (ref <=4.00)

## 2013-05-11 ENCOUNTER — Encounter: Payer: Self-pay | Admitting: Family Medicine

## 2013-06-19 ENCOUNTER — Encounter: Payer: Self-pay | Admitting: Internal Medicine

## 2013-08-08 ENCOUNTER — Other Ambulatory Visit: Payer: Self-pay

## 2013-08-08 NOTE — Telephone Encounter (Signed)
[facility] faxed req for Rx for clonazepam. Check local pharm and pt still has 4 RFs from last Rx written 05/09/13 for #20 w/5RFs, which we can cancel when this Rx sent if approved. Pt verified that he would like the Rx sent to [facility] d/t being cheaper IF it can be sent for 60 or 90 day supply. There is no benefit if sent for 30 day and would want it left locally if we can only write for 30 day. Pended for #60.

## 2013-08-08 NOTE — Telephone Encounter (Signed)
I cannot write for more than a 30 day supply due to [place] prescribing laws in [place].  I do no think any of our providers typically write for more than a 30 day supply due the nature of controlled substances, risk of abuse, etc.

## 2013-08-11 NOTE — Telephone Encounter (Signed)
Notified pt left at local pharm d/t not being able to Rx more than 30 day at a time.

## 2013-11-07 ENCOUNTER — Ambulatory Visit: Payer: 59 | Admitting: Family Medicine

## 2014-02-13 ENCOUNTER — Other Ambulatory Visit: Payer: Self-pay | Admitting: Gastroenterology

## 2014-02-13 ENCOUNTER — Encounter (HOSPITAL_COMMUNITY): Payer: Self-pay | Admitting: *Deleted

## 2014-02-13 ENCOUNTER — Encounter (HOSPITAL_COMMUNITY): Payer: Self-pay | Admitting: Pharmacy Technician

## 2014-02-18 ENCOUNTER — Other Ambulatory Visit: Payer: Self-pay | Admitting: Family Medicine

## 2014-02-19 ENCOUNTER — Encounter (HOSPITAL_COMMUNITY): Payer: 59 | Admitting: Anesthesiology

## 2014-02-19 ENCOUNTER — Ambulatory Visit (HOSPITAL_COMMUNITY)
Admission: RE | Admit: 2014-02-19 | Discharge: 2014-02-19 | Disposition: A | Discharge status: Home / Self Care | Payer: 59 | Source: Ambulatory Visit | Attending: Gastroenterology | Admitting: Gastroenterology

## 2014-02-19 ENCOUNTER — Encounter (HOSPITAL_COMMUNITY)
Admission: RE | Disposition: A | Discharge status: Home / Self Care | Payer: Self-pay | Source: Ambulatory Visit | Attending: Gastroenterology

## 2014-02-19 ENCOUNTER — Ambulatory Visit (HOSPITAL_COMMUNITY): Payer: 59 | Admitting: Anesthesiology

## 2014-02-19 DIAGNOSIS — Z1211 Encounter for screening for malignant neoplasm of colon: Secondary | ICD-10-CM | POA: Insufficient documentation

## 2014-02-19 DIAGNOSIS — F419 Anxiety disorder, unspecified: Secondary | ICD-10-CM | POA: Diagnosis not present

## 2014-02-19 DIAGNOSIS — K5730 Diverticulosis of large intestine without perforation or abscess without bleeding: Secondary | ICD-10-CM | POA: Diagnosis not present

## 2014-02-19 DIAGNOSIS — I10 Essential (primary) hypertension: Secondary | ICD-10-CM | POA: Diagnosis not present

## 2014-02-19 DIAGNOSIS — G4700 Insomnia, unspecified: Secondary | ICD-10-CM | POA: Insufficient documentation

## 2014-02-19 DIAGNOSIS — Z800 Family history of malignant neoplasm of digestive organs: Secondary | ICD-10-CM | POA: Diagnosis not present

## 2014-02-19 DIAGNOSIS — D123 Benign neoplasm of transverse colon: Secondary | ICD-10-CM | POA: Diagnosis not present

## 2014-02-19 DIAGNOSIS — Z86010 Personal history of colonic polyps: Secondary | ICD-10-CM | POA: Diagnosis not present

## 2014-02-19 HISTORY — PX: COLONOSCOPY WITH PROPOFOL: SHX5780

## 2014-02-19 SURGERY — COLONOSCOPY WITH PROPOFOL
Anesthesia: Monitor Anesthesia Care

## 2014-02-19 MED ORDER — PROPOFOL 10 MG/ML IV BOLUS
INTRAVENOUS | Status: AC
  Filled 2014-02-19: qty 20

## 2014-02-19 MED ORDER — LACTATED RINGERS IV SOLN
INTRAVENOUS | Status: DC
  Administered 2014-02-19: 1000 mL via INTRAVENOUS

## 2014-02-19 MED ORDER — PROPOFOL INFUSION 10 MG/ML OPTIME
INTRAVENOUS | Status: DC | PRN
  Administered 2014-02-19: 300 ug/kg/min via INTRAVENOUS

## 2014-02-19 MED ORDER — SODIUM CHLORIDE 0.9 % IV SOLN: INTRAVENOUS | Status: DC

## 2014-02-19 SURGICAL SUPPLY — 24 items

## 2014-02-19 NOTE — Anesthesia Postprocedure Evaluation (Signed)
  Anesthesia Post-op Note  Patient: Rueben Kassim  Procedure(s) Performed: Procedure(s) (LRB): COLONOSCOPY WITH PROPOFOL (N/A) ESOPHAGOGASTRODUODENOSCOPY (EGD) WITH PROPOFOL (N/A)  Patient Location: PACU  Anesthesia Type: MAC  Level of Consciousness: awake and alert   Airway and Oxygen Therapy: Patient Spontanous Breathing  Post-op Pain: mild  Post-op Assessment: Post-op Vital signs reviewed, Patient's Cardiovascular Status Stable, Respiratory Function Stable, Patent Airway and No signs of Nausea or vomiting  Last Vitals:  Filed Vitals:   02/19/14 1440  BP: 128/91  Pulse: 74  Temp:   Resp: 15    Post-op Vital Signs: stable   Complications: No apparent anesthesia complications \

## 2014-02-19 NOTE — Transfer of Care (Signed)
Immediate Anesthesia Transfer of Care Note  Patient: Kenneth Maynard  Procedure(s) Performed: Procedure(s): COLONOSCOPY WITH PROPOFOL (N/A) ESOPHAGOGASTRODUODENOSCOPY (EGD) WITH PROPOFOL (N/A)  Patient Location: PACU and Endoscopy Unit  Anesthesia Type:MAC  Level of Consciousness: alert , oriented and patient cooperative  Airway & Oxygen Therapy: Patient Spontanous Breathing and Patient connected to nasal cannula oxygen  Post-op Assessment: Report given to PACU RN and Post -op Vital signs reviewed and stable  Post vital signs: Reviewed and stable  Complications: No apparent anesthesia complications

## 2014-02-19 NOTE — Op Note (Signed)
Procedure: Surveillance colonoscopy. Normal surveillance colonoscopy performed on 07/01/2008. Father diagnosed with colon cancer at age 58. History of adenomatous colon polyps.  Endoscopist: Earle Gell  Premedication: Propofol administered by anesthesia  Procedure: The patient was placed in the left lateral decubitus position. Anal inspection and digital rectal exam were normal. The Pentax pediatric colonoscope was introduced into the rectum and advanced to the cecum. A normal-appearing appendiceal orifice was identified. A normal-appearing ileocecal valve was intubated and the terminal ileum inspected. Colonic preparation for the exam today was good.   Rectum. Normal. Retroflexed view of the distal rectum normal  Sigmoid colon and descending colon. Left colonic diverticulosis  Splenic flexure. Normal  Transverse colon. A 5 mm sessile polyp was removed from the proximal transverse colon with the cold snare.  Ascending colon. Normal  Cecum and ileocecal valve. Normal  Terminal ileum. Normal  Assessment: A small polyp was removed from the proximal transverse colon; otherwise normal surveillance colonoscopy  Recommendation: Schedule surveillance colonoscopy in 5 years.

## 2014-02-19 NOTE — Discharge Instructions (Signed)
Colonoscopy, Care After °These instructions give you information on caring for yourself after your procedure. Your doctor may also give you more specific instructions. Call your doctor if you have any problems or questions after your procedure. °HOME CARE °· Do not drive for 24 hours. °· Do not sign important papers or use machinery for 24 hours. °· You may shower. °· You may go back to your usual activities, but go slower for the first 24 hours. °· Take rest breaks often during the first 24 hours. °· Walk around or use warm packs on your belly (abdomen) if you have belly cramping or gas. °· Drink enough fluids to keep your pee (urine) clear or pale yellow. °· Resume your normal diet. Avoid heavy or fried foods. °· Avoid drinking alcohol for 24 hours or as told by your doctor. °· Only take medicines as told by your doctor. °If a tissue sample (biopsy) was taken during the procedure:  °· Do not take aspirin or blood thinners for 7 days, or as told by your doctor. °· Do not drink alcohol for 7 days, or as told by your doctor. °· Eat soft foods for the first 24 hours. °GET HELP IF: °You still have a small amount of blood in your poop (stool) 2-3 days after the procedure. °GET HELP RIGHT AWAY IF: °· You have more than a small amount of blood in your poop. °· You see clumps of tissue (blood clots) in your poop. °· Your belly is puffy (swollen). °· You feel sick to your stomach (nauseous) or throw up (vomit). °· You have a fever. °· You have belly pain that gets worse and medicine does not help. °MAKE SURE YOU: °· Understand these instructions. °· Will watch your condition. °· Will get help right away if you are not doing well or get worse. °Document Released: 06/10/2010 Document Revised: 05/13/2013 Document Reviewed: 01/13/2013 °ExitCare® Patient Information ©2015 [employer]. This information is not intended to replace advice given to you by your health care provider. Make sure you discuss any questions you have with  your health care provider. ° °

## 2014-02-19 NOTE — Anesthesia Preprocedure Evaluation (Signed)
Anesthesia Evaluation  Patient identified by MRN, date of birth, ID band Patient awake    Reviewed: Allergy & Precautions, H&P , NPO status , Patient's Chart, lab work & pertinent test results  Airway Mallampati: II TM Distance: >3 FB Neck ROM: Full    Dental no notable dental hx.    Pulmonary neg pulmonary ROS,  breath sounds clear to auscultation  Pulmonary exam normal       Cardiovascular hypertension, Pt. on medications Rhythm:Regular Rate:Normal     Neuro/Psych negative neurological ROS  negative psych ROS   GI/Hepatic negative GI ROS, Neg liver ROS,   Endo/Other  negative endocrine ROS  Renal/GU negative Renal ROS  negative genitourinary   Musculoskeletal negative musculoskeletal ROS (+)   Abdominal   Peds negative pediatric ROS (+)  Hematology negative hematology ROS (+)   Anesthesia Other Findings   Reproductive/Obstetrics negative OB ROS                           Anesthesia Physical Anesthesia Plan  ASA: II  Anesthesia Plan: MAC   Post-op Pain Management:    Induction:   Airway Management Planned: Nasal Cannula  Additional Equipment:   Intra-op Plan:   Post-operative Plan:   Informed Consent: I have reviewed the patients History and Physical, chart, labs and discussed the procedure including the risks, benefits and alternatives for the proposed anesthesia with the patient or authorized representative who has indicated his/her understanding and acceptance.   Dental advisory given  Plan Discussed with: CRNA  Anesthesia Plan Comments:         Anesthesia Quick Evaluation

## 2014-02-19 NOTE — H&P (Signed)
  Problem: Benign cervical esophageal stricture causing dysphagia. Surveillance colonoscopy. Personal history of adenomatous colon polyps removed colonoscopically.  History: The patient is a 58 year old male who has a benign cervical esophageal stricture which last required esophageal dilation in May 2013. The patient has redeveloped esophageal dysphagia and is scheduled to undergo esophagogastroduodenoscopy with savory esophageal dilation of the benign cervical esophageal stricture.  His father was diagnosed with colon cancer at age 5. He has undergone colonoscopic exams in the past to remove adenomatous colon polyps. He underwent a normal surveillance colonoscopy on 07/01/2008.  He is scheduled to undergo surveillance colonoscopy today.  Past medical history: Hypertension. Insomnia. Anxiety.  Medication allergies: None  Exam: The patient is alert and lying comfortably on the endoscopy stretcher. Lungs are clear to auscultation. Cardiac exam reveals a regular rhythm. Abdomen is soft and nontender to palpation.  Plan: Proceed with surveillance colonoscopy and diagnostic esophagogastroduodenoscopy with Savary esophageal dilation of the benign cervical esophageal stricture.  Addendum: The endoscopy staff cannot accommodate Savary esophageal dilation today due to lack of radiology support.

## 2014-02-20 ENCOUNTER — Other Ambulatory Visit: Payer: Self-pay | Admitting: Gastroenterology

## 2014-02-20 ENCOUNTER — Encounter (HOSPITAL_COMMUNITY): Payer: Self-pay | Admitting: Gastroenterology

## 2014-02-20 NOTE — Addendum Note (Signed)
Addended byClarene Essex on: 02/20/2014 12:37 PM   Modules accepted: Orders

## 2014-02-24 ENCOUNTER — Ambulatory Visit (HOSPITAL_COMMUNITY): Payer: 59

## 2014-02-24 ENCOUNTER — Ambulatory Visit (HOSPITAL_COMMUNITY)
Admission: RE | Admit: 2014-02-24 | Discharge: 2014-02-24 | Disposition: A | Discharge status: Home / Self Care | Payer: 59 | Source: Ambulatory Visit | Attending: Gastroenterology | Admitting: Gastroenterology

## 2014-02-24 ENCOUNTER — Encounter (HOSPITAL_COMMUNITY): Payer: Self-pay | Admitting: *Deleted

## 2014-02-24 ENCOUNTER — Encounter (HOSPITAL_COMMUNITY)
Admission: RE | Disposition: A | Discharge status: Home / Self Care | Payer: Self-pay | Source: Ambulatory Visit | Attending: Gastroenterology

## 2014-02-24 ENCOUNTER — Ambulatory Visit (HOSPITAL_COMMUNITY): Payer: 59 | Admitting: Anesthesiology

## 2014-02-24 ENCOUNTER — Encounter (HOSPITAL_COMMUNITY): Payer: 59 | Admitting: Anesthesiology

## 2014-02-24 DIAGNOSIS — K222 Esophageal obstruction: Secondary | ICD-10-CM | POA: Diagnosis not present

## 2014-02-24 DIAGNOSIS — I10 Essential (primary) hypertension: Secondary | ICD-10-CM | POA: Diagnosis not present

## 2014-02-24 DIAGNOSIS — F419 Anxiety disorder, unspecified: Secondary | ICD-10-CM | POA: Insufficient documentation

## 2014-02-24 DIAGNOSIS — R1310 Dysphagia, unspecified: Secondary | ICD-10-CM | POA: Diagnosis present

## 2014-02-24 DIAGNOSIS — K449 Diaphragmatic hernia without obstruction or gangrene: Secondary | ICD-10-CM | POA: Diagnosis not present

## 2014-02-24 HISTORY — PX: ESOPHAGOGASTRODUODENOSCOPY (EGD) WITH PROPOFOL: SHX5813

## 2014-02-24 HISTORY — PX: SAVORY DILATION: SHX5439

## 2014-02-24 SURGERY — ESOPHAGOGASTRODUODENOSCOPY (EGD) WITH PROPOFOL
Anesthesia: Monitor Anesthesia Care

## 2014-02-24 MED ORDER — PROPOFOL 10 MG/ML IV BOLUS
INTRAVENOUS | Status: AC
  Filled 2014-02-24: qty 20

## 2014-02-24 MED ORDER — SODIUM CHLORIDE 0.9 % IV SOLN: INTRAVENOUS | Status: DC

## 2014-02-24 MED ORDER — PROMETHAZINE HCL 25 MG/ML IJ SOLN: 6.2500 mg | INTRAMUSCULAR | Status: DC | PRN

## 2014-02-24 MED ORDER — MEPERIDINE HCL 100 MG/ML IJ SOLN: 6.2500 mg | INTRAMUSCULAR | Status: DC | PRN

## 2014-02-24 MED ORDER — LACTATED RINGERS IV SOLN
INTRAVENOUS | Status: DC
  Administered 2014-02-24: 12:00:00 via INTRAVENOUS
  Administered 2014-02-24: 1000 mL via INTRAVENOUS

## 2014-02-24 MED ORDER — PROPOFOL INFUSION 10 MG/ML OPTIME
INTRAVENOUS | Status: DC | PRN
  Administered 2014-02-24: 70 ug/kg/min via INTRAVENOUS

## 2014-02-24 MED ORDER — BUTAMBEN-TETRACAINE-BENZOCAINE 2-2-14 % EX AERO
INHALATION_SPRAY | CUTANEOUS | Status: DC | PRN
  Administered 2014-02-24: 2 via TOPICAL

## 2014-02-24 SURGICAL SUPPLY — 15 items

## 2014-02-24 NOTE — Op Note (Signed)
[facility] [place], [address]   ENDOSCOPY PROCEDURE REPORT  PATIENT: Kenneth Maynard, Kenneth Maynard  MR#: [id number] BIRTHDATE: 1955/10/15 , 7  yrs. old GENDER: male ENDOSCOPIST: Clarene Essex, MD REFERRED BY:  Earle Gell, M.D. PROCEDURE DATE:  02/24/2014 PROCEDURE:  Savary dilation of esophagus ASA CLASS:     Class II INDICATIONS:  dysphagia. MEDICATIONS: Propofol 250 mg IV TOPICAL ANESTHETIC: Cetacaine Spray  DESCRIPTION OF PROCEDURE: After the risks benefits and alternatives of the procedure were thoroughly explained, informed consent was obtained.  The Pentax Gastroscope Peds [id number] endoscope was introduced through the mouth and advanced to the ring at the upper esophageal sphincter but we could not advance it any further so we advanced the Savary wire under fluoroscopy and wants to go straight down he esophagus and then the customary loop in the antrum was confirmed and then we proceeded with Savary dilations using the 10,12 and 14 mm dilator in the customary fashion under fluoro guidance and there was no resistance to advancing all 3dilators and trace heme on each 1 and after the 14 was advanced and confirmed in the proper position under fluoroscopy in the stomach the dilator and wire were removed in tandem and the pediatric endoscope was inserted and easily advanced to the second portion of the duodenum , Without limitations.  The instrument was slowly withdrawn as the mucosa was fully examined.the findings are recorded  below and the patient tolerated the procedure well there was no obvious immediate complication    The findings are recorded below       Retroflexed views revealed a hiatal hernia.     The scope was then withdrawn from the patient and the procedure completed.  COMPLICATIONS: There were no immediate complications.  ENDOSCOPIC IMPRESSION: 1 proximal esophageal ring at the upper esophageal sphincter unable to pass the pediatric  endoscope status post Savary dilatation to 14 mm as above with minimal to moderate trauma at the end of the procedure endoscopically without active bleeding 2. Distal hiatal hernia with widely patent ring with very minimal trauma and heme easily washed off 3. Otherwise within normal limits EGD  RECOMMENDATIONS: very slowly advance diet consider pump inhibitor use and will recommend for at least a few weeks and followup with either myself or Dr. Wynetta Emery as needed  REPEAT EXAM: as needed  eSigned:  Clarene Essex, MD 02/24/2014 1:09 PM    YP:[id number], Hassell Done MD  CPT CODES: ICD CODES:  The ICD and CPT codes recommended by this software are interpretations from the data that the clinical staff has captured with the software.  The verification of the translation of this report to the ICD and CPT codes and modifiers is the sole responsibility of the health care institution and practicing physician where this report was generated.  [employer]. will not be held responsible for the validity of the ICD and CPT codes included on this report.  AMA assumes no liability for data contained or not contained herein. CPT is a Designer, television/film set of the [employer].  PATIENT NAME:  Kenneth Maynard, Kenneth Maynard MR#: [id number]

## 2014-02-24 NOTE — Transfer of Care (Signed)
Immediate Anesthesia Transfer of Care Note  Patient: Kenneth Maynard  Procedure(s) Performed: Procedure(s): ESOPHAGOGASTRODUODENOSCOPY (EGD) WITH PROPOFOL (N/A) SAVORY DILATION (N/A)  Patient Location: PACU  Anesthesia Type:MAC  Level of Consciousness: sedated  Airway & Oxygen Therapy: Patient Spontanous Breathing and Patient connected to nasal cannula oxygen  Post-op Assessment: Report given to PACU RN and Post -op Vital signs reviewed and stable  Post vital signs: Reviewed and stable  Complications: No apparent anesthesia complications

## 2014-02-24 NOTE — Anesthesia Preprocedure Evaluation (Signed)
Anesthesia Evaluation  Patient identified by MRN, date of birth, ID band Patient awake    Reviewed: Allergy & Precautions, H&P , NPO status , Patient's Chart, lab work & pertinent test results  Airway Mallampati: II TM Distance: >3 FB Neck ROM: Full    Dental no notable dental hx.    Pulmonary neg pulmonary ROS,  breath sounds clear to auscultation  Pulmonary exam normal       Cardiovascular hypertension, Pt. on medications Rhythm:Regular Rate:Normal     Neuro/Psych Anxiety negative neurological ROS     GI/Hepatic negative GI ROS, Neg liver ROS,   Endo/Other  negative endocrine ROS  Renal/GU negative Renal ROS  negative genitourinary   Musculoskeletal negative musculoskeletal ROS (+)   Abdominal   Peds negative pediatric ROS (+)  Hematology negative hematology ROS (+)   Anesthesia Other Findings   Reproductive/Obstetrics negative OB ROS                           Anesthesia Physical Anesthesia Plan  ASA: II  Anesthesia Plan: MAC   Post-op Pain Management:    Induction: Intravenous  Airway Management Planned:   Additional Equipment:   Intra-op Plan:   Post-operative Plan:   Informed Consent: I have reviewed the patients History and Physical, chart, labs and discussed the procedure including the risks, benefits and alternatives for the proposed anesthesia with the patient or authorized representative who has indicated his/her understanding and acceptance.   Dental advisory given  Plan Discussed with: CRNA  Anesthesia Plan Comments:         Anesthesia Quick Evaluation

## 2014-02-24 NOTE — Progress Notes (Signed)
Kenneth Maynard 12:00 PM  Subjective: Patient seen and examined and case discussed with his primary gastroenterologist Dr. Wynetta Emery and his last procedure was reviewed and he has no new complaints and his dysphasia is all in his neck but he has had some increased reflux lately  Objective: Vital signs stable afebrile no acute distress exam please see pre-assessment evaluation  Assessment: History of proximal esophageal  Plan: Okay to proceed with endoscopy and dilation with anesthesia assistance  [facility]

## 2014-02-24 NOTE — Discharge Instructions (Addendum)
Liquids only until 6 PM and if doing well may have soft solid and should began over-the-counter one a day omeprazole Prilosec or Nexium or Prevacid and call if you would like a prescription and call sooner if question or problem and do not take aspirin or arthritis pills for 3 days    Gastrointestinal Endoscopy, Care After Refer to this sheet in the next few weeks. These instructions provide you with information on caring for yourself after your procedure. Your caregiver may also give you more specific instructions. Your treatment has been planned according to current medical practices, but problems sometimes occur. Call your caregiver if you have any problems or questions after your procedure. HOME CARE INSTRUCTIONS  If you were given medicine to help you relax (sedative), do not drive, operate machinery, or sign important documents for 24 hours.  Avoid alcohol and hot or warm beverages for the first 24 hours after the procedure.  Only take over-the-counter or prescription medicines for pain, discomfort, or fever as directed by your caregiver. You may resume taking your normal medicines unless your caregiver tells you otherwise. Ask your caregiver when you may resume taking medicines that may cause bleeding, such as aspirin, clopidogrel, or warfarin.  You may return to your normal diet and activities on the day after your procedure, or as directed by your caregiver. Walking may help to reduce any bloated feeling in your abdomen.  Drink enough fluids to keep your urine clear or pale yellow.  You may gargle with salt water if you have a sore throat. SEEK IMMEDIATE MEDICAL CARE IF:  You have severe nausea or vomiting.  You have severe abdominal pain, abdominal cramps that last longer than 6 hours, or abdominal swelling (distention).  You have severe shoulder or back pain.  You have trouble swallowing.  You have shortness of breath, your breathing is shallow, or you are breathing faster  than normal.  You have a fever or a rapid heartbeat.  You vomit blood or material that looks like coffee grounds.  You have bloody, black, or tarry stools. MAKE SURE YOU:  Understand these instructions.  Will watch your condition.  Will get help right away if you are not doing well or get worse. Document Released: 12/21/2003 Document Revised: 09/22/2013 Document Reviewed: 08/08/2011 [facility] Patient Information 2015 [place], [place]. This information is not intended to replace advice given to you by your health care provider. Make sure you discuss any questions you have with your health care provider.

## 2014-02-25 ENCOUNTER — Encounter (HOSPITAL_COMMUNITY): Payer: Self-pay | Admitting: Gastroenterology

## 2014-02-25 NOTE — Anesthesia Postprocedure Evaluation (Signed)
  Anesthesia Post-op Note  Patient: Kenneth Maynard  Procedure(s) Performed: Procedure(s) (LRB): ESOPHAGOGASTRODUODENOSCOPY (EGD) WITH PROPOFOL (N/A) SAVORY DILATION (N/A)  Patient Location: PACU  Anesthesia Type: MAC  Level of Consciousness: awake and alert   Airway and Oxygen Therapy: Patient Spontanous Breathing  Post-op Pain: mild  Post-op Assessment: Post-op Vital signs reviewed, Patient's Cardiovascular Status Stable, Respiratory Function Stable, Patent Airway and No signs of Nausea or vomiting  Last Vitals:  Filed Vitals:   02/24/14 1330  BP: 146/98  Pulse: 67  Temp:   Resp: 20    Post-op Vital Signs: stable   Complications: No apparent anesthesia complications

## 2014-04-01 ENCOUNTER — Other Ambulatory Visit: Payer: Self-pay | Admitting: Physician Assistant

## 2014-04-24 ENCOUNTER — Other Ambulatory Visit: Payer: Self-pay | Admitting: Physician Assistant

## 2014-04-26 NOTE — Telephone Encounter (Signed)
LMOM to CB w/f/up plan. 

## 2014-05-05 NOTE — Telephone Encounter (Signed)
Reached pt and advised overdue for f/up. He agreed to come in and transferred to appt center to set up appt bf end of yr (or pt will walk in bf then). Sent in 2 more wks of med Rf. appt sch for 05/12/14 w/ Debbie.

## 2014-05-12 ENCOUNTER — Ambulatory Visit (INDEPENDENT_AMBULATORY_CARE_PROVIDER_SITE_OTHER): Payer: 59 | Admitting: Family Medicine

## 2014-05-12 ENCOUNTER — Encounter: Payer: Self-pay | Admitting: Family Medicine

## 2014-05-12 VITALS — BP 127/90 | HR 94 | Temp 98.1°F | Resp 16 | Ht 65.0 in | Wt 149.6 lb

## 2014-05-12 DIAGNOSIS — Z0000 Encounter for general adult medical examination without abnormal findings: Secondary | ICD-10-CM

## 2014-05-12 DIAGNOSIS — Z139 Encounter for screening, unspecified: Secondary | ICD-10-CM

## 2014-05-12 DIAGNOSIS — G4700 Insomnia, unspecified: Secondary | ICD-10-CM

## 2014-05-12 DIAGNOSIS — E7800 Pure hypercholesterolemia, unspecified: Secondary | ICD-10-CM

## 2014-05-12 DIAGNOSIS — Z1389 Encounter for screening for other disorder: Secondary | ICD-10-CM

## 2014-05-12 DIAGNOSIS — J309 Allergic rhinitis, unspecified: Secondary | ICD-10-CM

## 2014-05-12 DIAGNOSIS — Z125 Encounter for screening for malignant neoplasm of prostate: Secondary | ICD-10-CM

## 2014-05-12 DIAGNOSIS — I10 Essential (primary) hypertension: Secondary | ICD-10-CM

## 2014-05-12 DIAGNOSIS — Z130 Encounter for screening for diseases of the blood and blood-forming organs and certain disorders involving the immune mechanism: Secondary | ICD-10-CM

## 2014-05-12 LAB — CBC WITH DIFFERENTIAL/PLATELET
BASOS PCT: 0 % (ref 0–1)
Basophils Absolute: 0 10*3/uL (ref 0.0–0.1)
Eosinophils Absolute: 0 10*3/uL (ref 0.0–0.7)
Eosinophils Relative: 0 % (ref 0–5)
HEMATOCRIT: 44.1 % (ref 39.0–52.0)
HEMOGLOBIN: 14.8 g/dL (ref 13.0–17.0)
LYMPHS PCT: 21 % (ref 12–46)
Lymphs Abs: 1.1 10*3/uL (ref 0.7–4.0)
MCH: 30.3 pg (ref 26.0–34.0)
MCHC: 33.6 g/dL (ref 30.0–36.0)
MCV: 90.4 fL (ref 78.0–100.0)
MONOS PCT: 8 % (ref 3–12)
MPV: 9.3 fL — ABNORMAL LOW (ref 9.4–12.4)
Monocytes Absolute: 0.4 10*3/uL (ref 0.1–1.0)
NEUTROS ABS: 3.6 10*3/uL (ref 1.7–7.7)
NEUTROS PCT: 71 % (ref 43–77)
Platelets: 303 10*3/uL (ref 150–400)
RBC: 4.88 MIL/uL (ref 4.22–5.81)
RDW: 13.2 % (ref 11.5–15.5)
WBC: 5.1 10*3/uL (ref 4.0–10.5)

## 2014-05-12 LAB — COMPREHENSIVE METABOLIC PANEL
ALBUMIN: 4.6 g/dL (ref 3.5–5.2)
ALT: 15 U/L (ref 0–53)
AST: 16 U/L (ref 0–37)
Alkaline Phosphatase: 65 U/L (ref 39–117)
BUN: 12 mg/dL (ref 6–23)
CALCIUM: 9.8 mg/dL (ref 8.4–10.5)
CO2: 29 mEq/L (ref 19–32)
CREATININE: 1.03 mg/dL (ref 0.50–1.35)
Chloride: 100 mEq/L (ref 96–112)
GLUCOSE: 85 mg/dL (ref 70–99)
POTASSIUM: 4.4 meq/L (ref 3.5–5.3)
Sodium: 138 mEq/L (ref 135–145)
Total Bilirubin: 0.5 mg/dL (ref 0.2–1.2)
Total Protein: 7.6 g/dL (ref 6.0–8.3)

## 2014-05-12 LAB — LIPID PANEL
Cholesterol: 232 mg/dL — ABNORMAL HIGH (ref 0–200)
HDL: 64 mg/dL (ref >39)
LDL Cholesterol: 157 mg/dL — ABNORMAL HIGH (ref 0–99)
TRIGLYCERIDES: 57 mg/dL (ref <150)
Total CHOL/HDL Ratio: 3.6 Ratio
VLDL: 11 mg/dL (ref 0–40)

## 2014-05-12 LAB — POCT URINALYSIS DIPSTICK
Bilirubin, UA: NEGATIVE
Blood, UA: NEGATIVE
Glucose, UA: NEGATIVE
Ketones, UA: NEGATIVE
LEUKOCYTES UA: NEGATIVE
NITRITE UA: NEGATIVE
Protein, UA: NEGATIVE
Spec Grav, UA: 1.015
UROBILINOGEN UA: 0.2
pH, UA: 6

## 2014-05-12 MED ORDER — LISINOPRIL-HYDROCHLOROTHIAZIDE 10-12.5 MG PO TABS
1.0000 | ORAL_TABLET | Freq: Every day | ORAL | Status: DC
Start: 2014-05-12 — End: 2014-10-08

## 2014-05-12 MED ORDER — FLUTICASONE PROPIONATE 50 MCG/ACT NA SUSP: 2.0000 | Freq: Every day | NASAL | Status: DC

## 2014-05-12 NOTE — Progress Notes (Signed)
Subjective:    Patient ID: Kenneth Maynard, male    DOB: 12/12/1955, 58 y.o.   MRN: [id number]  HPI Patient presents today for CPE. Last CPE-2014 Tdap- 4/08 Flu- had already Colonoscopy- 2015- repeat 5 years [place]- regular  Back pain- low, chronic, worse in the am. No previous injury. Better when he has time to exercise. No weakness in arms or legs,no falls.   Sleep is better. Falls asleep better, but wakes 1x in the night 5 out of 7 nights. Has difficulty going back to sleep 2x/ month. Will take melatonin with good results. Has not been taking klonopin.   Takes zyrtec daily, but doesn't feel like it works as well as it used to. Has year round allergy symptoms, with nasal drainage most days.  Tolerating BP medication without side effects. Has a BP cuff at home, checks infrequently, always normal.  Review of Systems  Constitutional: Negative.   HENT: Negative.   Eyes: Negative.   Respiratory: Negative.   Cardiovascular: Negative.   Gastrointestinal: Negative.   Endocrine: Negative.   Genitourinary: Negative.   Musculoskeletal: Positive for back pain.  Skin: Negative.   Allergic/Immunologic: Positive for environmental allergies.  Neurological: Negative.   Hematological: Negative.   Psychiatric/Behavioral: Positive for sleep disturbance.      Objective:   Physical Exam  Constitutional: He is oriented to person, place, and time. He appears well-developed and well-nourished. No distress.  HENT:  Head: Normocephalic and atraumatic.  Right Ear: Tympanic membrane, external ear and ear canal normal.  Left Ear: Tympanic membrane, external ear and ear canal normal.  Nose: Nose normal.  Mouth/Throat: Uvula is midline and mucous membranes are normal. No oropharyngeal exudate.  Cobblestone appearance of upper palate.   Eyes: Conjunctivae and EOM are normal. Pupils are equal, round, and reactive to light. Right eye exhibits no discharge. Left eye exhibits no  discharge. No scleral icterus.  Neck: Normal range of motion. Neck supple. No JVD present. No thyromegaly present.  Cardiovascular: Normal rate, regular rhythm and normal heart sounds.   Pulmonary/Chest: Effort normal and breath sounds normal.  Abdominal: Soft. Bowel sounds are normal. He exhibits no distension and no mass. There is no tenderness. There is no rebound and no guarding. Hernia confirmed negative in the right inguinal area and confirmed negative in the left inguinal area.  Genitourinary: Rectum normal, prostate normal, testes normal and penis normal. No penile tenderness.  Musculoskeletal: Normal range of motion. He exhibits no edema or tenderness.  Lymphadenopathy:    He has no cervical adenopathy.       Right: No inguinal adenopathy present.       Left: No inguinal adenopathy present.  Neurological: He is alert and oriented to person, place, and time. He has normal reflexes.  Skin: Skin is warm and dry. He is not diaphoretic.  Psychiatric: He has a normal mood and affect. His behavior is normal. Judgment and thought content normal.  Vitals reviewed. BP 127/90 mmHg  Pulse 94  Temp(Src) 98.1 F (36.7 C) (Oral)  Resp 16  Ht 5\' 5"  (1.651 m)  Wt 149 lb 9.6 oz (67.858 kg)  BMI 24.89 kg/m2  SpO2 99%    Assessment & Plan:  1. Routine general medical examination at a health care facility - CPE in 1 year  2. Essential hypertension - lisinopril-hydrochlorothiazide (PRINZIDE,ZESTORETIC) 10-12.5 MG per tablet; Take 1 tablet by mouth daily.  Dispense: 90 tablet; Refill: 1 - CBC with Differential - Lipid Panel - Comprehensive metabolic panel -  Follow up HTN 6 months  3. Insomnia - This is somewhat improved from previous years, encouraged regular exercise, can use melatonin nightly for a couple of weeks.  4. Screening for prostate cancer - PSA  5. Screening for deficiency anemia - CBC with Differential  6. Screening for blood or protein in urine - Urinalysis  Dipstick  7. Elevated cholesterol - Lipid Panel  8. Allergic rhinitis, unspecified allergic rhinitis type - fluticasone (FLONASE) 50 MCG/ACT nasal spray; Place 2 sprays into both nostrils daily.  Dispense: 16 g; Refill: [place], FNP-BC  [facility]  05/12/2014 1:15 PM

## 2014-05-12 NOTE — Progress Notes (Signed)
   Subjective:    Patient ID: Kenneth Maynard, male    DOB: 06-23-1955, 58 y.o.   MRN: [id number]  HPI    Review of Systems  Constitutional: Negative.   HENT: Negative.   Eyes: Negative.   Respiratory: Negative.   Cardiovascular: Negative.   Gastrointestinal: Negative.   Endocrine: Negative.   Genitourinary: Negative.   Musculoskeletal: Positive for back pain.  Skin: Negative.   Allergic/Immunologic: Positive for environmental allergies.  Neurological: Negative.   Hematological: Negative.   Psychiatric/Behavioral: Positive for sleep disturbance.       Objective:   Physical Exam        Assessment & Plan:

## 2014-05-12 NOTE — Patient Instructions (Addendum)
Try to find time to exercise- every little bit helps  Insomnia Insomnia is frequent trouble falling and/or staying asleep. Insomnia can be a long term problem or a short term problem. Both are common. Insomnia can be a short term problem when the wakefulness is related to a certain stress or worry. Long term insomnia is often related to ongoing stress during waking hours and/or poor sleeping habits. Overtime, sleep deprivation itself can make the problem worse. Every little thing feels more severe because you are overtired and your ability to cope is decreased. CAUSES   Stress, anxiety, and depression.  Poor sleeping habits.  Distractions such as TV in the bedroom.  Naps close to bedtime.  Engaging in emotionally charged conversations before bed.  Technical reading before sleep.  Alcohol and other sedatives. They may make the problem worse. They can hurt normal sleep patterns and normal dream activity.  Stimulants such as caffeine for several hours prior to bedtime.  Pain syndromes and shortness of breath can cause insomnia.  Exercise late at night.  Changing time zones may cause sleeping problems (jet lag). It is sometimes helpful to have someone observe your sleeping patterns. They should look for periods of not breathing during the night (sleep apnea). They should also look to see how long those periods last. If you live alone or observers are uncertain, you can also be observed at a sleep clinic where your sleep patterns will be professionally monitored. Sleep apnea requires a checkup and treatment. Give your caregivers your medical history. Give your caregivers observations your family has made about your sleep.  SYMPTOMS   Not feeling rested in the morning.  Anxiety and restlessness at bedtime.  Difficulty falling and staying asleep. TREATMENT   Your caregiver may prescribe treatment for an underlying medical disorders. Your caregiver can give advice or help if you are  using alcohol or other drugs for self-medication. Treatment of underlying problems will usually eliminate insomnia problems.  Medications can be prescribed for short time use. They are generally not recommended for lengthy use.  Over-the-counter sleep medicines are not recommended for lengthy use. They can be habit forming.  You can promote easier sleeping by making lifestyle changes such as:  Using relaxation techniques that help with breathing and reduce muscle tension.  Exercising earlier in the day.  Changing your diet and the time of your last meal. No night time snacks.  Establish a regular time to go to bed.  Counseling can help with stressful problems and worry.  Soothing music and white noise may be helpful if there are background noises you cannot remove.  Stop tedious detailed work at least one hour before bedtime. HOME CARE INSTRUCTIONS   Keep a diary. Inform your caregiver about your progress. This includes any medication side effects. See your caregiver regularly. Take note of:  Times when you are asleep.  Times when you are awake during the night.  The quality of your sleep.  How you feel the next day. This information will help your caregiver care for you.  Get out of bed if you are still awake after 15 minutes. Read or do some quiet activity. Keep the lights down. Wait until you feel sleepy and go back to bed.  Keep regular sleeping and waking hours. Avoid naps.  Exercise regularly.  Avoid distractions at bedtime. Distractions include watching television or engaging in any intense or detailed activity like attempting to balance the household checkbook.  Develop a bedtime ritual. Keep a familiar routine  of bathing, brushing your teeth, climbing into bed at the same time each night, listening to soothing music. Routines increase the success of falling to sleep faster.  Use relaxation techniques. This can be using breathing and muscle tension release  routines. It can also include visualizing peaceful scenes. You can also help control troubling or intruding thoughts by keeping your mind occupied with boring or repetitive thoughts like the old concept of counting sheep. You can make it more creative like imagining planting one beautiful flower after another in your backyard garden.  During your day, work to eliminate stress. When this is not possible use some of the previous suggestions to help reduce the anxiety that accompanies stressful situations. MAKE SURE YOU:   Understand these instructions.  Will watch your condition.  Will get help right away if you are not doing well or get worse. Document Released: 05/05/2000 Document Revised: 07/31/2011 Document Reviewed: 06/05/2007 [facility] Patient Information 2015 [place], [place]. This information is not intended to replace advice given to you by your health care provider. Make sure you discuss any questions you have with your health care provider. Allergic Rhinitis Allergic rhinitis is when the mucous membranes in the nose respond to allergens. Allergens are particles in the air that cause your body to have an allergic reaction. This causes you to release allergic antibodies. Through a chain of events, these eventually cause you to release histamine into the blood stream. Although meant to protect the body, it is this release of histamine that causes your discomfort, such as frequent sneezing, congestion, and an itchy, runny nose.  CAUSES  Seasonal allergic rhinitis (hay fever) is caused by pollen allergens that may come from grasses, trees, and weeds. Year-round allergic rhinitis (perennial allergic rhinitis) is caused by allergens such as house dust mites, pet dander, and mold spores.  SYMPTOMS   Nasal stuffiness (congestion).  Itchy, runny nose with sneezing and tearing of the eyes. DIAGNOSIS  Your health care provider can help you determine the allergen or allergens that trigger your  symptoms. If you and your health care provider are unable to determine the allergen, skin or blood testing may be used. TREATMENT  Allergic rhinitis does not have a cure, but it can be controlled by:  Medicines and allergy shots (immunotherapy).  Avoiding the allergen. Hay fever may often be treated with antihistamines in pill or nasal spray forms. Antihistamines block the effects of histamine. There are over-the-counter medicines that may help with nasal congestion and swelling around the eyes. Check with your health care provider before taking or giving this medicine.  If avoiding the allergen or the medicine prescribed do not work, there are many new medicines your health care provider can prescribe. Stronger medicine may be used if initial measures are ineffective. Desensitizing injections can be used if medicine and avoidance does not work. Desensitization is when a patient is given ongoing shots until the body becomes less sensitive to the allergen. Make sure you follow up with your health care provider if problems continue. HOME CARE INSTRUCTIONS It is not possible to completely avoid allergens, but you can reduce your symptoms by taking steps to limit your exposure to them. It helps to know exactly what you are allergic to so that you can avoid your specific triggers. SEEK MEDICAL CARE IF:   You have a fever.  You develop a cough that does not stop easily (persistent).  You have shortness of breath.  You start wheezing.  Symptoms interfere with normal daily activities.  Document Released: 01/31/2001 Document Revised: 05/13/2013 Document Reviewed: 01/13/2013 [facility] Patient Information 2015 [place], [place]. This information is not intended to replace advice given to you by your health care provider. Make sure you discuss any questions you have with your health care provider.

## 2014-05-13 LAB — PSA: PSA: 0.9 ng/mL (ref <=4.00)

## 2014-06-19 ENCOUNTER — Other Ambulatory Visit: Payer: Self-pay | Admitting: Family Medicine

## 2014-08-16 ENCOUNTER — Encounter (HOSPITAL_COMMUNITY): Payer: Self-pay | Admitting: Emergency Medicine

## 2014-08-16 ENCOUNTER — Inpatient Hospital Stay (HOSPITAL_COMMUNITY)
Admission: EM | Admit: 2014-08-16 | Discharge: 2014-08-19 | DRG: 378 | Disposition: A | Discharge status: Home / Self Care | Payer: 59 | Attending: Internal Medicine | Admitting: Internal Medicine

## 2014-08-16 DIAGNOSIS — G4700 Insomnia, unspecified: Secondary | ICD-10-CM | POA: Diagnosis present

## 2014-08-16 DIAGNOSIS — R55 Syncope and collapse: Secondary | ICD-10-CM | POA: Diagnosis not present

## 2014-08-16 DIAGNOSIS — K219 Gastro-esophageal reflux disease without esophagitis: Secondary | ICD-10-CM | POA: Diagnosis present

## 2014-08-16 DIAGNOSIS — Z538 Procedure and treatment not carried out for other reasons: Secondary | ICD-10-CM | POA: Diagnosis present

## 2014-08-16 DIAGNOSIS — Z7982 Long term (current) use of aspirin: Secondary | ICD-10-CM

## 2014-08-16 DIAGNOSIS — D62 Acute posthemorrhagic anemia: Secondary | ICD-10-CM | POA: Diagnosis present

## 2014-08-16 DIAGNOSIS — Z86010 Personal history of colonic polyps: Secondary | ICD-10-CM

## 2014-08-16 DIAGNOSIS — T7840XA Allergy, unspecified, initial encounter: Secondary | ICD-10-CM | POA: Diagnosis present

## 2014-08-16 DIAGNOSIS — K922 Gastrointestinal hemorrhage, unspecified: Principal | ICD-10-CM | POA: Diagnosis present

## 2014-08-16 DIAGNOSIS — K269 Duodenal ulcer, unspecified as acute or chronic, without hemorrhage or perforation: Secondary | ICD-10-CM | POA: Diagnosis present

## 2014-08-16 DIAGNOSIS — I10 Essential (primary) hypertension: Secondary | ICD-10-CM | POA: Diagnosis present

## 2014-08-16 DIAGNOSIS — K222 Esophageal obstruction: Secondary | ICD-10-CM | POA: Diagnosis present

## 2014-08-16 DIAGNOSIS — F419 Anxiety disorder, unspecified: Secondary | ICD-10-CM | POA: Diagnosis present

## 2014-08-16 HISTORY — DX: Esophageal obstruction: K22.2

## 2014-08-16 HISTORY — DX: Gastrointestinal hemorrhage, unspecified: K92.2

## 2014-08-16 LAB — CBG MONITORING, ED: Glucose-Capillary: 89 mg/dL (ref 70–99)

## 2014-08-16 LAB — I-STAT TROPONIN, ED: TROPONIN I, POC: 0 ng/mL (ref 0.00–0.08)

## 2014-08-16 LAB — CBC
HEMATOCRIT: 35.2 % — AB (ref 39.0–52.0)
HEMOGLOBIN: 11.4 g/dL — AB (ref 13.0–17.0)
MCH: 30.6 pg (ref 26.0–34.0)
MCHC: 32.4 g/dL (ref 30.0–36.0)
MCV: 94.4 fL (ref 78.0–100.0)
PLATELETS: 231 10*3/uL (ref 150–400)
RBC: 3.73 MIL/uL — ABNORMAL LOW (ref 4.22–5.81)
RDW: 12.5 % (ref 11.5–15.5)
WBC: 8.4 10*3/uL (ref 4.0–10.5)

## 2014-08-16 NOTE — ED Notes (Signed)
Pt brought to ED by [facility] from home after having a syncope episode, pt states he when up to the bathroom and before getting to the commode he had one episode of incontinence on loose stool and pass out. LOC for 1-2 min before [facility] arrival. Pt AO x4, diaphoretic and c/o dizziness, BP 60/40 on [facility] arrival. Pt vomiting vomited x 1 on the way to ED, 4mg  Zofran IV given by EMS. SR on monitor last set of VS BP 126/77, HR 86, R-16 98% on RA.

## 2014-08-16 NOTE — ED Provider Notes (Signed)
CSN: [id number]     Arrival date & time 08/16/14  2231 History   First MD Initiated Contact with Patient 08/16/14 2255     This chart was scribed for Evelina Bucy, MD by Forrestine Him, ED Scribe. This patient was seen in room B19C/B19C and the patient's care was started 11:51 PM.   Chief Complaint  Patient presents with  . Loss of Consciousness  . Diarrhea  . Emesis   Patient is a 59 y.o. male presenting with syncope. The history is provided by the patient. No language interpreter was used.  Loss of Consciousness Episode history:  Single Most recent episode:  Today Timing:  Constant Progression:  Unchanged Chronicity:  New Context: standing up   Witnessed: no   Relieved by:  Nothing Worsened by:  Nothing tried Associated symptoms: chest pain and dizziness   Associated symptoms: no fever and no shortness of breath     HPI Comments: Kenneth Maynard brought in by ambulance from home is a 59 y.o. male with a PMHx of HTN, acid reflux, and esophogeal stricter who presents to the Emergency Department here after episode of loss of consciousness occuring just prior to arrival. Pt states he felt dizzy, went to use the bathroom and before getting to the commode, had an episode of incontinence-loose stool. Wife then reports he had an episode of syncope lasting 1-2 minutes prior to [facility] arrival. Per EMS, pt was dizzy, diaphoretic with BP 60/40. Zofran given through IV en route to ED. Pt mentions chest tightness and nausea lasting a few minutes en route to department which was resolved after taking a few sips of water and taking an antacid. Kenneth Maynard admits to a 2-3 day history of dark black stools. He admits to a bleeding ulcer approximately 30-35 years ago with unknown cause. At that time, pt also experienced dark/black stools. No recent fever, chills, SOB. No history of heart issues.  Endoscopy and colonoscopy performed October 2015- No abnormal findings  Past Medical History  Diagnosis Date  .  Hypertension   . Allergy    Past Surgical History  Procedure Laterality Date  . Esophagogastroduodenoscopy  10/19/2011    Procedure: ESOPHAGOGASTRODUODENOSCOPY (EGD);  Surgeon: Garlan Fair, MD;  Location: [facility];  Service: Endoscopy;  Laterality: N/A;  . Savory dilation  10/19/2011    Procedure: SAVORY DILATION;  Surgeon: Garlan Fair, MD;  Location: [facility];  Service: Endoscopy;  Laterality: N/A;  need carm  . Colonoscopy with propofol N/A 02/19/2014    Procedure: COLONOSCOPY WITH PROPOFOL;  Surgeon: Garlan Fair, MD;  Location: [facility];  Service: Endoscopy;  Laterality: N/A;  . Esophagogastroduodenoscopy (egd) with propofol N/A 02/24/2014    Procedure: ESOPHAGOGASTRODUODENOSCOPY (EGD) WITH PROPOFOL;  Surgeon: Jeryl Columbia, MD;  Location: [facility];  Service: Endoscopy;  Laterality: N/A;  . Savory dilation N/A 02/24/2014    Procedure: SAVORY DILATION;  Surgeon: Jeryl Columbia, MD;  Location: [facility];  Service: Endoscopy;  Laterality: N/A;   Family History  Problem Relation Age of Onset  . Colon cancer Father   . Hypertension Father   . Hyperlipidemia Father   . Stroke Other   . Cancer Mother     ovarian  . Hypertension Sister    History  Substance Use Topics  . Smoking status: Never Smoker   . Smokeless tobacco: Never Used  . Alcohol Use: 0.6 oz/week    1 Glasses of wine per week     Comment: 1-2 drinks rare use  Review of Systems  Constitutional: Negative for fever and chills.  Respiratory: Negative for shortness of breath.   Cardiovascular: Positive for chest pain and syncope.  Gastrointestinal: Positive for blood in stool.  Neurological: Positive for dizziness and syncope.  All other systems reviewed and are negative.     Allergies  Review of patient's allergies indicates no known allergies.  Home Medications   Prior to Admission medications   Medication Sig Start Date End Date Taking? Authorizing Provider  aspirin 81 MG tablet  Take 81 mg by mouth every morning.     Historical Provider, MD  cetirizine (ZYRTEC) 10 MG tablet Take 10 mg by mouth at bedtime.    Historical Provider, MD  clonazePAM (KLONOPIN) 0.5 MG tablet Take 1 tablet (0.5 mg total) by mouth at bedtime as needed (sleep). Patient not taking: Reported on 05/12/2014 05/09/13   Shawnee Knapp, MD  fluticasone [facility]) 50 MCG/ACT nasal spray Place 2 sprays into both nostrils daily. 05/12/14   Elby Beck, FNP  ibuprofen (ADVIL,MOTRIN) 200 MG tablet Take 600 mg by mouth once as needed for headache or mild pain.    Historical Provider, MD  lisinopril-hydrochlorothiazide (PRINZIDE,ZESTORETIC) 10-12.5 MG per tablet Take 1 tablet by mouth daily. 05/12/14   Elby Beck, FNP  Multiple Vitamin (MULTIVITAMIN WITH MINERALS) TABS tablet Take 1 tablet by mouth daily.    Historical Provider, MD   Triage Vitals: BP 92/62 mmHg  Pulse 98  Temp(Src) 98.1 F (36.7 C) (Oral)  Resp 21  Ht 5\' 6"  (1.676 m)  Wt 150 lb (68.04 kg)  BMI 24.22 kg/m2  SpO2 100%   Physical Exam  Constitutional: He is oriented to person, place, and time. He appears well-developed and well-nourished. No distress.  HENT:  Head: Normocephalic and atraumatic.  Mouth/Throat: No oropharyngeal exudate.  Eyes: EOM are normal. Pupils are equal, round, and reactive to light.  Neck: Normal range of motion. Neck supple.  Cardiovascular: Normal rate, regular rhythm, normal heart sounds and intact distal pulses.  Exam reveals no friction rub.   No murmur heard. Pulmonary/Chest: Effort normal and breath sounds normal. No respiratory distress. He has no wheezes. He has no rales.  Abdominal: Soft. He exhibits no distension. There is no tenderness. There is no rebound.  Genitourinary: Guaiac positive stool (black stool, heme positive).  Musculoskeletal: Normal range of motion. He exhibits no edema.  Neurological: He is alert and oriented to person, place, and time.  Skin: Skin is warm and dry. He is not  diaphoretic.  Psychiatric: He has a normal mood and affect. Judgment normal.  Nursing note and vitals reviewed.   ED Course  Procedures (including critical care time)  DIAGNOSTIC STUDIES: Oxygen Saturation is 100% on RA, Normal by my interpretation.    COORDINATION OF CARE: 12:00 AM-Discussed treatment plan with pt at bedside and pt agreed to plan.     Labs Review Labs Reviewed  CBC - Abnormal; Notable for the following:    RBC 3.73 (*)    Hemoglobin 11.4 (*)    HCT 35.2 (*)    All other components within normal limits  BASIC METABOLIC PANEL  I-STAT TROPOININ, ED  CBG MONITORING, ED    Imaging Review No results found.   EKG Interpretation   Date/Time:  Sunday August 16 2014 22:41:29 EDT Ventricular Rate:  87 PR Interval:  141 QRS Duration: 70 QT Interval:  335 QTC Calculation: 403 R Axis:   42 Text Interpretation:  Sinus rhythm Abnormal R-wave progression, early  transition Minimal ST elevation, anterior leads No prior for comparison  Confirmed by [facility]  MD, Adetokunbo Mccadden ([id number]) on 08/16/2014 10:56:34 PM      MDM   Final diagnoses:  Gastrointestinal hemorrhage, unspecified gastritis, unspecified gastrointestinal hemorrhage type  syncope  59 year old male here with syncope. Passed out after standing up. Had some burning abdominal pain with black stools earlier in the day. Had some chest tightness also. History of bleeding ulcer. Here softer blood pressures, we'll give fluids. Belly benign. Hemoccult grossly positive with black stool. Dr. Oletta Lamas with GI notified, will see him in the morning. Plan for admission.  I personally performed the services described in this documentation, which was scribed in my presence. The recorded information has been reviewed and is accurate.     Evelina Bucy, MD 08/17/14 [phone]

## 2014-08-17 ENCOUNTER — Inpatient Hospital Stay (HOSPITAL_COMMUNITY): Payer: 59 | Admitting: Anesthesiology

## 2014-08-17 ENCOUNTER — Encounter (HOSPITAL_COMMUNITY)
Admission: EM | Disposition: A | Discharge status: Home / Self Care | Payer: Self-pay | Source: Home / Self Care | Attending: Internal Medicine

## 2014-08-17 ENCOUNTER — Inpatient Hospital Stay (HOSPITAL_COMMUNITY): Payer: 59

## 2014-08-17 ENCOUNTER — Encounter (HOSPITAL_COMMUNITY): Payer: Self-pay | Admitting: Internal Medicine

## 2014-08-17 DIAGNOSIS — F419 Anxiety disorder, unspecified: Secondary | ICD-10-CM | POA: Diagnosis not present

## 2014-08-17 DIAGNOSIS — R55 Syncope and collapse: Secondary | ICD-10-CM | POA: Diagnosis not present

## 2014-08-17 DIAGNOSIS — I10 Essential (primary) hypertension: Secondary | ICD-10-CM | POA: Insufficient documentation

## 2014-08-17 DIAGNOSIS — T7840XA Allergy, unspecified, initial encounter: Secondary | ICD-10-CM | POA: Diagnosis present

## 2014-08-17 DIAGNOSIS — D62 Acute posthemorrhagic anemia: Secondary | ICD-10-CM

## 2014-08-17 DIAGNOSIS — K922 Gastrointestinal hemorrhage, unspecified: Secondary | ICD-10-CM | POA: Insufficient documentation

## 2014-08-17 DIAGNOSIS — K269 Duodenal ulcer, unspecified as acute or chronic, without hemorrhage or perforation: Secondary | ICD-10-CM | POA: Diagnosis present

## 2014-08-17 DIAGNOSIS — Z7982 Long term (current) use of aspirin: Secondary | ICD-10-CM | POA: Diagnosis not present

## 2014-08-17 DIAGNOSIS — K222 Esophageal obstruction: Secondary | ICD-10-CM | POA: Diagnosis present

## 2014-08-17 DIAGNOSIS — Z538 Procedure and treatment not carried out for other reasons: Secondary | ICD-10-CM | POA: Diagnosis present

## 2014-08-17 DIAGNOSIS — K219 Gastro-esophageal reflux disease without esophagitis: Secondary | ICD-10-CM | POA: Diagnosis present

## 2014-08-17 DIAGNOSIS — Z86010 Personal history of colonic polyps: Secondary | ICD-10-CM | POA: Diagnosis not present

## 2014-08-17 DIAGNOSIS — G4700 Insomnia, unspecified: Secondary | ICD-10-CM | POA: Diagnosis present

## 2014-08-17 DIAGNOSIS — K264 Chronic or unspecified duodenal ulcer with hemorrhage: Secondary | ICD-10-CM | POA: Diagnosis not present

## 2014-08-17 HISTORY — PX: ESOPHAGOGASTRODUODENOSCOPY: SHX5428

## 2014-08-17 LAB — COMPREHENSIVE METABOLIC PANEL
ALBUMIN: 2.8 g/dL — AB (ref 3.5–5.2)
ALK PHOS: 37 U/L — AB (ref 39–117)
ALT: 13 U/L (ref 0–53)
ANION GAP: 5 (ref 5–15)
AST: 15 U/L (ref 0–37)
BUN: 24 mg/dL — AB (ref 6–23)
CHLORIDE: 112 mmol/L (ref 96–112)
CO2: 23 mmol/L (ref 19–32)
CREATININE: 0.86 mg/dL (ref 0.50–1.35)
Calcium: 8.4 mg/dL (ref 8.4–10.5)
GFR calc Af Amer: >90 mL/min (ref >90)
Glucose, Bld: 80 mg/dL (ref 70–99)
POTASSIUM: 4.1 mmol/L (ref 3.5–5.1)
Sodium: 140 mmol/L (ref 135–145)
Total Bilirubin: 0.6 mg/dL (ref 0.3–1.2)
Total Protein: 5.3 g/dL — ABNORMAL LOW (ref 6.0–8.3)

## 2014-08-17 LAB — CBC
HCT: 31.9 % — ABNORMAL LOW (ref 39.0–52.0)
HCT: 29.3 % — ABNORMAL LOW (ref 39.0–52.0)
HCT: 29.1 % — ABNORMAL LOW (ref 39.0–52.0)
HEMATOCRIT: 29.8 % — AB (ref 39.0–52.0)
HEMOGLOBIN: 9.8 g/dL — AB (ref 13.0–17.0)
Hemoglobin: 10.4 g/dL — ABNORMAL LOW (ref 13.0–17.0)
Hemoglobin: 9.7 g/dL — ABNORMAL LOW (ref 13.0–17.0)
Hemoglobin: 9.6 g/dL — ABNORMAL LOW (ref 13.0–17.0)
MCH: 30.9 pg (ref 26.0–34.0)
MCH: 30.7 pg (ref 26.0–34.0)
MCH: 31.1 pg (ref 26.0–34.0)
MCH: 31.2 pg (ref 26.0–34.0)
MCHC: 32.6 g/dL (ref 30.0–36.0)
MCHC: 32.9 g/dL (ref 30.0–36.0)
MCHC: 33.1 g/dL (ref 30.0–36.0)
MCHC: 33 g/dL (ref 30.0–36.0)
MCV: 94.7 fL (ref 78.0–100.0)
MCV: 93.4 fL (ref 78.0–100.0)
MCV: 93.9 fL (ref 78.0–100.0)
MCV: 94.5 fL (ref 78.0–100.0)
Platelets: 228 10*3/uL (ref 150–400)
Platelets: 218 10*3/uL (ref 150–400)
Platelets: 210 10*3/uL (ref 150–400)
Platelets: 151 10*3/uL (ref 150–400)
RBC: 3.37 MIL/uL — AB (ref 4.22–5.81)
RBC: 3.19 MIL/uL — AB (ref 4.22–5.81)
RBC: 3.12 MIL/uL — ABNORMAL LOW (ref 4.22–5.81)
RBC: 3.08 MIL/uL — AB (ref 4.22–5.81)
RDW: 12.7 % (ref 11.5–15.5)
RDW: 12.8 % (ref 11.5–15.5)
RDW: 12.9 % (ref 11.5–15.5)
RDW: 12.9 % (ref 11.5–15.5)
WBC: 9.3 10*3/uL (ref 4.0–10.5)
WBC: 7.2 10*3/uL (ref 4.0–10.5)
WBC: 5.6 10*3/uL (ref 4.0–10.5)
WBC: 6.7 10*3/uL (ref 4.0–10.5)

## 2014-08-17 LAB — I-STAT TROPONIN, ED: Troponin i, poc: 0 ng/mL (ref 0.00–0.08)

## 2014-08-17 LAB — ABO/RH: ABO/RH(D): B POS

## 2014-08-17 LAB — BASIC METABOLIC PANEL
ANION GAP: 8 (ref 5–15)
BUN: 35 mg/dL — AB (ref 6–23)
CHLORIDE: 105 mmol/L (ref 96–112)
CO2: 26 mmol/L (ref 19–32)
Calcium: 8.5 mg/dL (ref 8.4–10.5)
Creatinine, Ser: 1.07 mg/dL (ref 0.50–1.35)
GFR calc Af Amer: 87 mL/min — ABNORMAL LOW (ref >90)
GFR, EST NON AFRICAN AMERICAN: 75 mL/min — AB (ref >90)
Glucose, Bld: 161 mg/dL — ABNORMAL HIGH (ref 70–99)
POTASSIUM: 4.1 mmol/L (ref 3.5–5.1)
SODIUM: 139 mmol/L (ref 135–145)

## 2014-08-17 LAB — TYPE AND SCREEN
ABO/RH(D): B POS
Antibody Screen: NEGATIVE

## 2014-08-17 LAB — GLUCOSE, CAPILLARY
Glucose-Capillary: 54 mg/dL — ABNORMAL LOW (ref 70–99)
Glucose-Capillary: 135 mg/dL — ABNORMAL HIGH (ref 70–99)

## 2014-08-17 LAB — RAPID URINE DRUG SCREEN, HOSP PERFORMED
AMPHETAMINES: NOT DETECTED
Barbiturates: NOT DETECTED
Benzodiazepines: NOT DETECTED
Cocaine: NOT DETECTED
OPIATES: NOT DETECTED
Tetrahydrocannabinol: NOT DETECTED

## 2014-08-17 LAB — OCCULT BLOOD, POC DEVICE: FECAL OCCULT BLD: POSITIVE — AB

## 2014-08-17 LAB — APTT: APTT: 23 s — AB (ref 24–37)

## 2014-08-17 LAB — LACTIC ACID, PLASMA: LACTIC ACID, VENOUS: 1.6 mmol/L (ref 0.5–2.0)

## 2014-08-17 LAB — PROTIME-INR
INR: 1.14 (ref 0.00–1.49)
Prothrombin Time: 14.7 seconds (ref 11.6–15.2)

## 2014-08-17 SURGERY — EGD (ESOPHAGOGASTRODUODENOSCOPY)
Anesthesia: Monitor Anesthesia Care

## 2014-08-17 MED ORDER — FENTANYL CITRATE 0.05 MG/ML IJ SOLN
INTRAMUSCULAR | Status: DC | PRN
  Administered 2014-08-17: 100 ug via INTRAVENOUS

## 2014-08-17 MED ORDER — PANTOPRAZOLE SODIUM 40 MG IV SOLR: 40.0000 mg | Freq: Two times a day (BID) | INTRAVENOUS | Status: DC

## 2014-08-17 MED ORDER — PROPOFOL 10 MG/ML IV BOLUS
INTRAVENOUS | Status: DC | PRN
Start: 2014-08-17 — End: 2014-08-17
  Administered 2014-08-17: 20 mg via INTRAVENOUS
  Administered 2014-08-17 (×2): 10 mg via INTRAVENOUS

## 2014-08-17 MED ORDER — SODIUM CHLORIDE 0.9 % IJ SOLN
3.0000 mL | Freq: Two times a day (BID) | INTRAMUSCULAR | Status: DC
  Administered 2014-08-17 – 2014-08-18 (×2): 3 mL via INTRAVENOUS

## 2014-08-17 MED ORDER — ADULT MULTIVITAMIN W/MINERALS CH
1.0000 | ORAL_TABLET | Freq: Every day | ORAL | Status: DC
  Filled 2014-08-17 (×3): qty 1

## 2014-08-17 MED ORDER — ONDANSETRON HCL 4 MG PO TABS: 4.0000 mg | ORAL_TABLET | Freq: Four times a day (QID) | ORAL | Status: DC | PRN

## 2014-08-17 MED ORDER — FENTANYL CITRATE 0.05 MG/ML IJ SOLN
INTRAMUSCULAR | Status: AC
  Filled 2014-08-17: qty 5

## 2014-08-17 MED ORDER — PANTOPRAZOLE SODIUM 40 MG IV SOLR
40.0000 mg | Freq: Once | INTRAVENOUS | Status: AC
  Administered 2014-08-17: 40 mg via INTRAVENOUS
  Filled 2014-08-17: qty 40

## 2014-08-17 MED ORDER — BUTAMBEN-TETRACAINE-BENZOCAINE 2-2-14 % EX AERO
INHALATION_SPRAY | CUTANEOUS | Status: DC | PRN
  Administered 2014-08-17: 2 via TOPICAL

## 2014-08-17 MED ORDER — ONDANSETRON HCL 4 MG/2ML IJ SOLN
INTRAMUSCULAR | Status: DC | PRN
  Administered 2014-08-17: 4 mg via INTRAVENOUS

## 2014-08-17 MED ORDER — ONDANSETRON HCL 4 MG/2ML IJ SOLN: 4.0000 mg | Freq: Four times a day (QID) | INTRAMUSCULAR | Status: DC | PRN

## 2014-08-17 MED ORDER — SUCCINYLCHOLINE CHLORIDE 20 MG/ML IJ SOLN
INTRAMUSCULAR | Status: AC
Start: 2014-08-17 — End: 2014-08-17
  Filled 2014-08-17: qty 1

## 2014-08-17 MED ORDER — PHENYLEPHRINE 40 MCG/ML (10ML) SYRINGE FOR IV PUSH (FOR BLOOD PRESSURE SUPPORT)
PREFILLED_SYRINGE | INTRAVENOUS | Status: AC
  Filled 2014-08-17: qty 10

## 2014-08-17 MED ORDER — SODIUM CHLORIDE 0.9 % IV SOLN: INTRAVENOUS | Status: DC

## 2014-08-17 MED ORDER — SODIUM CHLORIDE 0.9 % IJ SOLN
INTRAMUSCULAR | Status: AC
  Filled 2014-08-17: qty 10

## 2014-08-17 MED ORDER — CLONAZEPAM 0.5 MG PO TABS: 0.5000 mg | ORAL_TABLET | Freq: Every evening | ORAL | Status: DC | PRN

## 2014-08-17 MED ORDER — MIDAZOLAM HCL 5 MG/5ML IJ SOLN
INTRAMUSCULAR | Status: DC | PRN
  Administered 2014-08-17: 2 mg via INTRAVENOUS

## 2014-08-17 MED ORDER — DEXTROSE 50 % IV SOLN
INTRAVENOUS | Status: AC
  Filled 2014-08-17: qty 50

## 2014-08-17 MED ORDER — SODIUM CHLORIDE 0.9 % IV SOLN
8.0000 mg/h | INTRAVENOUS | Status: DC
  Administered 2014-08-17 – 2014-08-19 (×4): 8 mg/h via INTRAVENOUS
  Filled 2014-08-17 (×12): qty 80

## 2014-08-17 MED ORDER — LORATADINE 10 MG PO TABS
10.0000 mg | ORAL_TABLET | Freq: Every day | ORAL | Status: DC
  Filled 2014-08-17 (×3): qty 1

## 2014-08-17 MED ORDER — MORPHINE SULFATE 2 MG/ML IJ SOLN: 2.0000 mg | INTRAMUSCULAR | Status: DC | PRN

## 2014-08-17 MED ORDER — FLUTICASONE PROPIONATE 50 MCG/ACT NA SUSP
2.0000 | Freq: Every day | NASAL | Status: DC
  Filled 2014-08-17: qty 16

## 2014-08-17 MED ORDER — LACTATED RINGERS IV SOLN
INTRAVENOUS | Status: DC | PRN
  Administered 2014-08-17: 11:00:00 via INTRAVENOUS

## 2014-08-17 MED ORDER — LIDOCAINE HCL (CARDIAC) 20 MG/ML IV SOLN
INTRAVENOUS | Status: AC
  Filled 2014-08-17: qty 5

## 2014-08-17 MED ORDER — ROCURONIUM BROMIDE 50 MG/5ML IV SOLN
INTRAVENOUS | Status: AC
  Filled 2014-08-17: qty 1

## 2014-08-17 MED ORDER — SODIUM CHLORIDE 0.9 % IV SOLN
INTRAVENOUS | Status: DC
  Administered 2014-08-17: 125 mL/h via INTRAVENOUS
  Administered 2014-08-17 – 2014-08-19 (×2): via INTRAVENOUS

## 2014-08-17 MED ORDER — MIDAZOLAM HCL 2 MG/2ML IJ SOLN
INTRAMUSCULAR | Status: AC
  Filled 2014-08-17: qty 2

## 2014-08-17 MED ORDER — DEXTROSE 50 % IV SOLN
1.0000 | Freq: Once | INTRAVENOUS | Status: AC
  Administered 2014-08-17: 50 mL via INTRAVENOUS
  Filled 2014-08-17: qty 50

## 2014-08-17 MED ORDER — PROPOFOL 10 MG/ML IV BOLUS
INTRAVENOUS | Status: AC
  Filled 2014-08-17: qty 20

## 2014-08-17 MED ORDER — ONDANSETRON HCL 4 MG/2ML IJ SOLN
INTRAMUSCULAR | Status: AC
  Filled 2014-08-17: qty 2

## 2014-08-17 MED ORDER — EPHEDRINE SULFATE 50 MG/ML IJ SOLN
INTRAMUSCULAR | Status: AC
  Filled 2014-08-17: qty 1

## 2014-08-17 MED ORDER — LIDOCAINE HCL (CARDIAC) 20 MG/ML IV SOLN
INTRAVENOUS | Status: DC | PRN
  Administered 2014-08-17: 60 mg via INTRAVENOUS

## 2014-08-17 MED ORDER — SODIUM CHLORIDE 0.9 % IV BOLUS (SEPSIS)
1000.0000 mL | Freq: Once | INTRAVENOUS | Status: AC
  Administered 2014-08-17: 1000 mL via INTRAVENOUS

## 2014-08-17 NOTE — Progress Notes (Signed)
CBG-135, recheck after pt having low BS on floor,

## 2014-08-17 NOTE — Progress Notes (Signed)
GI addendum: Surprisingly could not pass the standard endoscope beyond the arytenoid cartilages into the upper esophagus. There appeared to be a possible tight ring or stricture. I withdrew the standard endoscope made attempts with the pediatric endoscope which were similarly unsuccessful. It was decided to terminate the procedure and obtain a barium swallow to better assess his cervical esophageal anatomy. Continue to treat bleeding empirically as peptic ulcer disease.

## 2014-08-17 NOTE — Transfer of Care (Signed)
Immediate Anesthesia Transfer of Care Note  Patient: Kenneth Maynard  Procedure(s) Performed: Procedure(s): ESOPHAGOGASTRODUODENOSCOPY (EGD) (N/A)  Patient Location: PACU and Endoscopy Unit  Anesthesia Type:MAC  Level of Consciousness: awake, alert , oriented and patient cooperative  Airway & Oxygen Therapy: Patient Spontanous Breathing and Patient connected to nasal cannula oxygen  Post-op Assessment: Report given to RN, Post -op Vital signs reviewed and stable and Patient moving all extremities  Post vital signs: Reviewed and stable  Last Vitals:  Filed Vitals:   08/17/14 1130  BP: 119/71  Pulse: 107  Temp:   Resp: 14    Complications: No apparent anesthesia complications

## 2014-08-17 NOTE — Progress Notes (Signed)
08/17/2014  10:03 AM Notified Endo of PT's. Blood Sugar and the AMP of D50 given. [place]

## 2014-08-17 NOTE — Anesthesia Preprocedure Evaluation (Addendum)
Anesthesia Evaluation  Patient identified by MRN, date of birth, ID band Patient awake    Reviewed: Allergy & Precautions, NPO status , Patient's Chart, lab work & pertinent test results  Airway Mallampati: II  TM Distance: >3 FB Neck ROM: Full    Dental no notable dental hx.    Pulmonary neg pulmonary ROS,  breath sounds clear to auscultation  Pulmonary exam normal       Cardiovascular hypertension, Pt. on medications Rhythm:Regular Rate:Normal  EKG unchanged   Neuro/Psych Anxiety negative neurological ROS  negative psych ROS   GI/Hepatic negative GI ROS, Neg liver ROS, GI Bleed, SP esophageal dilation 02/2014   Endo/Other  negative endocrine ROS  Renal/GU negative Renal ROS  negative genitourinary   Musculoskeletal negative musculoskeletal ROS (+)   Abdominal   Peds negative pediatric ROS (+)  Hematology  (+) anemia , 9/30 acute drop    Anesthesia Other Findings   Reproductive/Obstetrics negative OB ROS                           Anesthesia Physical Anesthesia Plan  ASA: III  Anesthesia Plan: MAC   Post-op Pain Management:    Induction: Intravenous  Airway Management Planned: Nasal Cannula and Simple Face Mask  Additional Equipment:   Intra-op Plan:   Post-operative Plan:   Informed Consent: I have reviewed the patients History and Physical, chart, labs and discussed the procedure including the risks, benefits and alternatives for the proposed anesthesia with the patient or authorized representative who has indicated his/her understanding and acceptance.   Dental advisory given  Plan Discussed with: CRNA and Surgeon  Anesthesia Plan Comments:        Anesthesia Quick Evaluation

## 2014-08-17 NOTE — H&P (Signed)
[facility] History and Physical  Kenneth Maynard BFX:[id number] DOB: [birth year] DOA: 08/16/2014  Referring physician: ED physician PCP: Kennon Portela, MD  Specialists:   Chief Complaint: syncope and dark stool  HPI: Kenneth Maynard is a 59 y.o. male with past medical history of hypertension, allergy, insomnia, history of a GI bleed in 35 years ago (he is not sure whether he had peptic ulcer), anxiety, esophageal stricture (post status of dilation), who presents with syncope and dark stool.  Patient reports that he passed out when he got up to the bathroom at 9:30 PM, which lasted for about 1 or 2 min. He lost control of bowel movement. Patient had mild dizziness, but no chest pain, palpitation, shortness of breath, unilateral weakness or seizure activity. He had a mild lip cut, but no obvious head injury. EMS reports that ptient was diaphoretic with BP 60/40 on EMS arrival. Pt vomiting vomited x 1 on the way to ED. Patient reports that he had 2 episode of dark stool yesterday. He has epigastric discomfort. He is currently taking aspirin, and occasionally takes ibuprofen. He has history of GI bleeding 35 years ago, but not very sure whether he had peptic ulcer. He had a colonoscopy on 02/2014, and had a small polyp removed. He also had a EGD on 10/60/15, which showed s/p of esophageal stricture dilation and hiatal hernia. Patient denies fever, chills, cough, chest pain, SOB, dysuria, urgency, frequency, hematuria, skin rashes, joint pain or leg swelling. No unilateral weakness, numbness or tingling sensations. No vision change or hearing loss.  In ED, patient was found to have hemoglobin drop from 14.8 on 05/12/14 to 11.4. Blood pressure soft, negative troponin, electrolytes okay, but with high BUN/creatinine ratio (35/1.07). EKG showed small Q wave in aVL and slight ST elevation V2, which is not new and existed in previous EKG on 04/24/11. Patient is admitted to inpatient of further  evaluation and treatment. GI was consulted by ED.  Review of Systems: As presented in the history of presenting illness, rest negative.  Where does patient live?  At home Can patient participate in ADLs? Yes  Allergy: No Known Allergies  Past Medical History  Diagnosis Date  . Hypertension   . Allergy     Past Surgical History  Procedure Laterality Date  . Esophagogastroduodenoscopy  10/19/2011    Procedure: ESOPHAGOGASTRODUODENOSCOPY (EGD);  Surgeon: Garlan Fair, MD;  Location: [facility];  Service: Endoscopy;  Laterality: N/A;  . Savory dilation  10/19/2011    Procedure: SAVORY DILATION;  Surgeon: Garlan Fair, MD;  Location: [facility];  Service: Endoscopy;  Laterality: N/A;  need carm  . Colonoscopy with propofol N/A 02/19/2014    Procedure: COLONOSCOPY WITH PROPOFOL;  Surgeon: Garlan Fair, MD;  Location: [facility];  Service: Endoscopy;  Laterality: N/A;  . Esophagogastroduodenoscopy (egd) with propofol N/A 02/24/2014    Procedure: ESOPHAGOGASTRODUODENOSCOPY (EGD) WITH PROPOFOL;  Surgeon: Jeryl Columbia, MD;  Location: [facility];  Service: Endoscopy;  Laterality: N/A;  . Savory dilation N/A 02/24/2014    Procedure: SAVORY DILATION;  Surgeon: Jeryl Columbia, MD;  Location: [facility];  Service: Endoscopy;  Laterality: N/A;    Social History:  reports that he has never smoked. He has never used smokeless tobacco. He reports that he drinks about 0.6 oz of alcohol per week. He reports that he does not use illicit drugs.  Family History:  Family History  Problem Relation Age of Onset  . Colon cancer Father   . Hypertension  Father   . Hyperlipidemia Father   . Stroke Other   . Cancer Mother     ovarian  . Hypertension Sister      Prior to Admission medications   Medication Sig Start Date End Date Taking? Authorizing Provider  aspirin 81 MG tablet Take 81 mg by mouth every morning.     Historical Provider, MD  cetirizine (ZYRTEC) 10 MG tablet Take 10 mg by  mouth at bedtime.    Historical Provider, MD  clonazePAM (KLONOPIN) 0.5 MG tablet Take 1 tablet (0.5 mg total) by mouth at bedtime as needed (sleep). Patient not taking: Reported on 05/12/2014 05/09/13   Shawnee Knapp, MD  fluticasone [facility]) 50 MCG/ACT nasal spray Place 2 sprays into both nostrils daily. 05/12/14   Elby Beck, FNP  ibuprofen (ADVIL,MOTRIN) 200 MG tablet Take 600 mg by mouth once as needed for headache or mild pain.    Historical Provider, MD  lisinopril-hydrochlorothiazide (PRINZIDE,ZESTORETIC) 10-12.5 MG per tablet Take 1 tablet by mouth daily. 05/12/14   Elby Beck, FNP  Multiple Vitamin (MULTIVITAMIN WITH MINERALS) TABS tablet Take 1 tablet by mouth daily.    Historical Provider, MD    Physical Exam: Filed Vitals:   08/17/14 0000 08/17/14 0015 08/17/14 0020 08/17/14 0030  BP: 98/63 103/66 103/66 98/62  Pulse:   96 93  Temp:      TempSrc:      Resp: 18 23 22 12   Height:      Weight:      SpO2:   99% 100%   General: Not in acute distress HEENT:       Eyes: PERRL, EOMI, no scleral icterus       ENT: No discharge from the ears and nose, no pharynx injection, no tonsillar enlargement.        Neck: No JVD, no bruit, no mass felt. Cardiac: S1/S2, RRR, No murmurs, No gallops or rubs Pulm: Good air movement bilaterally. Clear to auscultation bilaterally. No rales, wheezing, rhonchi or rubs. Abd: Soft, nondistended, nontender, no rebound pain, no organomegaly, BS present Ext: No edema bilaterally. 2+DP/PT pulse bilaterally Musculoskeletal: No joint deformities, erythema, or stiffness, ROM full Skin: No rashes.  Neuro: Alert and oriented X3, cranial nerves II-XII grossly intact, muscle strength 5/5 in all extremeties, sensation to light touch intact. Brachial reflex 2+ bilaterally. Knee reflex 1+ bilaterally. Negative Babinski's sign. Normal finger to nose test. Psych: Patient is not psychotic, no suicidal or hemocidal ideation.  Labs on Admission:  Basic  Metabolic Panel:  Recent Labs Lab 08/16/14 2300  NA 139  K 4.1  CL 105  CO2 26  GLUCOSE 161*  BUN 35*  CREATININE 1.07  CALCIUM 8.5   Liver Function Tests: No results for input(s): AST, ALT, ALKPHOS, BILITOT, PROT, ALBUMIN in the last 168 hours. No results for input(s): LIPASE, AMYLASE in the last 168 hours. No results for input(s): AMMONIA in the last 168 hours. CBC:  Recent Labs Lab 08/16/14 2300  WBC 8.4  HGB 11.4*  HCT 35.2*  MCV 94.4  PLT 231   Cardiac Enzymes: No results for input(s): CKTOTAL, CKMB, CKMBINDEX, TROPONINI in the last 168 hours.  BNP (last 3 results) No results for input(s): BNP in the last 8760 hours.  ProBNP (last 3 results) No results for input(s): PROBNP in the last 8760 hours.  CBG:  Recent Labs Lab 08/16/14 2343  GLUCAP 89    Radiological Exams on Admission: No results found.  EKG: Independently reviewed.  Small  Q wave in aVL and slight ST elevation V2, which is not new and existed in previous EKG on 04/24/11.  Assessment/Plan Principal Problem:   Syncope Active Problems:   HTN (hypertension)   Anxiety   GIB (gastrointestinal bleeding)   Allergy  GIB: It is likely due to upper GI bleeding given patient is taking ibuprofen, aspirin and history of GI bleeding. His high BUN/creatinine ratio (35/1.07) is also consistent with upper GI bleeding. Patient's blood pressure was low initally, which improved to above SBP 100. Currently hemodynamically stable. - will admit to tele bed - GI consulted by Ed, will follow up recommendations - NPO for possible EGD - IVFL: 1L of NS bolus, followed by 171mL/hr - Start IV pantoprazole gtt - Zofran IV for nausea - Avoid NSAIDs, ASA and SQ heparin - Maintain IV access (2 large bore IVs) - Monitor closely and follow q6h cbc, transfuse as necessary. Goal of Hgb is <7.0 - LaB: INR, PTT, troponin x 3, Lactate  Syncope: It is likely due to orthostatic status given ongoing GI bleeding. No focal  neurological findings on examination. Currently patient's mental status is normal. -Monitor on telemetry bed -Orthostatic vital signs -neuro checks every 4 hours -Follow-up CT head which was ordered by ED -IV fluid as above  Hypertension: Blood pressure is soft currently. -Hold Prinzide  Anxiety: -When necessary Klonopin  DVT ppx: SCD  Code Status: Full code Family Communication:  Yes, patient's  wife  at bed side Disposition Plan: Admit to inpatient   Date of Service 08/17/2014    Ivor Costa [facility] Pager [phone]  If 7PM-7AM, please contact night-coverage [email or web address] Password [facility] 08/17/2014, 12:46 AM

## 2014-08-17 NOTE — Op Note (Signed)
[facility] [place], [id number]   ENDOSCOPY PROCEDURE REPORT  PATIENT: Kenneth Maynard, Kenneth Maynard  MR#: [id number] BIRTHDATE: July 01, 1955 , 62  yrs. old GENDER: male ENDOSCOPIST: Teena Irani, MD REFERRED BY: PROCEDURE DATE:  August 31, 2014 PROCEDURE: ASA CLASS: INDICATIONS:  GI bleeding MEDICATIONS: M AC TOPICAL ANESTHETIC:  DESCRIPTION OF PROCEDURE: After the risks benefits and alternatives of the procedure were thoroughly explained, informed consent was obtained.  The Pentax video      endoscope was introduced through the mouth and advanced to the into the posterior oropharynxbehind the arytenoid cartilages. , and epiglottis but could not clearly identify or intubate the upper esophagus. 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 I elected to switch endoscope's for the pediatric endoscope but ran into the same difficulty and could not intubate the esophagus with impression of a proximal esophageal or hypopharyngeal obstruction. and   it was decided to abort the procedure at this point  with the intention of obtaining a barium swallow to better define the anatomy            The scope was then withdrawn from the patient and the procedure completed.  COMPLICATIONS: There were no immediate complications.  ENDOSCOPIC IMPRESSION: aborted attempted endoscopy due to presumed hypopharyngeal or proximal esophageal obstruction  RECOMMENDATIONS: Will treat clinically as upper GI bleed with proton pump inhibitor, obtain barium swallow to better define the upper esophageal anatomy. Transfuse as necessary.  REPEAT EXAM:  eSigned:  Teena Irani, MD 08/31/14 11:33 AM    CC:  CPT CODES: ICD CODES:  The ICD and CPT codes recommended by this software are interpretations from the data that the clinical staff has captured with the software.  The verification of the  translation of this report to the ICD and CPT codes and modifiers is the sole responsibility of the health care institution and practicing physician where this report was generated.  [employer] will not be held responsible for the validity of the ICD and CPT codes included on this report.  AMA assumes no liability for data contained or not contained herein. CPT is a Designer, television/film set of the [employer].

## 2014-08-17 NOTE — Anesthesia Postprocedure Evaluation (Signed)
  Anesthesia Post-op Note  Patient: Kenneth Maynard  Procedure(s) Performed: Procedure(s) (LRB): ESOPHAGOGASTRODUODENOSCOPY (EGD) (N/A)  Patient Location: PACU  Anesthesia Type: MAC  Level of Consciousness: awake and alert   Airway and Oxygen Therapy: Patient Spontanous Breathing  Post-op Pain: mild  Post-op Assessment: Post-op Vital signs reviewed, Patient's Cardiovascular Status Stable, Respiratory Function Stable, Patent Airway and No signs of Nausea or vomiting  Last Vitals:  Filed Vitals:   08/17/14 1140  BP: 111/77  Pulse: 104  Temp:   Resp: 16    Post-op Vital Signs: stable   Complications: No apparent anesthesia complications

## 2014-08-17 NOTE — Progress Notes (Signed)
Dr. Amedeo Plenty at [facility] explaining inability to pass adult or peds scope due to narrowing in esophagus.  Plan to obtain xray to see any obstruction.  Will start pt on PPI med and observe pt.  Pt expressed understanding of POC to Dr. Amedeo Plenty.

## 2014-08-17 NOTE — Progress Notes (Signed)
08/17/2014 10:01 AM Blood sugar check was 54. No hypoglycemic orders.  MD paged, orders received for 1 Amp of D50. [place]

## 2014-08-17 NOTE — Consult Note (Signed)
[facility] Reason for consult: G.I. bleeding. Referring Physician: [facility]. PCP: Dr. Vertell Limber. Primary G.I.: Dr. Earle Gell  Kenneth Maynard is an 59 y.o. male.  HPI: 59 year old followed routinely by Dr. Earle Gell. Has a history of esophageal strictures dilated many times in the past. Last dilated by Dr. Watt Climes 10/15 to 14 mm using Savary method. No ulcers or other lesions were noted. Patient also had colonoscopy 10/15 by Dr. Wynetta Emery done for follow-up with history of colon polyps and family history of colon cancer. This was negative other than a small lipoma. He has had apparently some G.I. bleeding the very distant past. He has had some vague epigastric discomfort for one to 2 weeks it's been intermittent. He has been taking Gas X with some relief. He has not been taking any medication for acid. He denies any regular heartburn or indigestion. He does take ibuprofen intermittently for back pain, 3 tablets approximately every 2 weeks no more frequently. Denies any other nonsteroidal's. Had dark stools yesterday and weakness. Stools were black and quiet positive in the emergency room. Hemoglobin 11.4 dropping to 9.8 this morning. BUN was elevated. Patient has no significant symptoms at the present time.   Past Medical History  Diagnosis Date  . Hypertension   . Allergy   . Esophageal stricture   . GIB (gastrointestinal bleeding)     Past Surgical History  Procedure Laterality Date  . Esophagogastroduodenoscopy  10/19/2011    Procedure: ESOPHAGOGASTRODUODENOSCOPY (EGD);  Surgeon: Garlan Fair, MD;  Location: [facility];  Service: Endoscopy;  Laterality: N/A;  . Savory dilation  10/19/2011    Procedure: SAVORY DILATION;  Surgeon: Garlan Fair, MD;  Location: [facility];  Service: Endoscopy;  Laterality: N/A;  need carm  . Colonoscopy with propofol N/A 02/19/2014    Procedure: COLONOSCOPY WITH PROPOFOL;  Surgeon: Garlan Fair, MD;  Location: [facility];  Service: Endoscopy;  Laterality: N/A;  . Esophagogastroduodenoscopy (egd) with propofol N/A 02/24/2014    Procedure: ESOPHAGOGASTRODUODENOSCOPY (EGD) WITH PROPOFOL;  Surgeon: Jeryl Columbia, MD;  Location: [facility];  Service: Endoscopy;  Laterality: N/A;  . Savory dilation N/A 02/24/2014    Procedure: SAVORY DILATION;  Surgeon: Jeryl Columbia, MD;  Location: [facility];  Service: Endoscopy;  Laterality: N/A;    Family History  Problem Relation Age of Onset  . Colon cancer Father   . Hypertension Father   . Hyperlipidemia Father   . Stroke Other   . Cancer Mother     ovarian  . Hypertension Sister     Social History:  reports that he has never smoked. He has never used smokeless tobacco. He reports that he drinks about 0.6 oz of alcohol per week. He reports that he does not use illicit drugs.  Allergies: No Known Allergies  Medications; Prior to Admission medications   Medication Sig Start Date End Date Taking? Authorizing Provider  cetirizine (ZYRTEC) 10 MG tablet Take 10 mg by mouth daily.    Yes Historical Provider, MD  Cholecalciferol (VITAMIN D-3 PO) Take 1 tablet by mouth daily.   Yes Historical Provider, MD  fluticasone (FLONASE) 50 MCG/ACT nasal spray Place 2 sprays into both nostrils daily. Patient taking differently: Place 2 sprays into both nostrils daily as needed for allergies.  05/12/14  Yes Elby Beck, FNP  ibuprofen (ADVIL,MOTRIN) 200 MG tablet Take 600 mg by mouth daily as needed for headache or mild pain.   Yes Historical Provider, MD  lisinopril-hydrochlorothiazide ([place]) 10-12.5  MG per tablet Take 1 tablet by mouth daily. 05/12/14  Yes Elby Beck, FNP  aspirin 81 MG tablet Take 81 mg by mouth every morning.     Historical Provider, MD  clonazePAM (KLONOPIN) 0.5 MG tablet Take 1 tablet (0.5 mg total) by mouth at bedtime as needed (sleep). Patient not taking: Reported on 05/12/2014 05/09/13   Shawnee Knapp, MD  Multiple Vitamin  (MULTIVITAMIN WITH MINERALS) TABS tablet Take 1 tablet by mouth daily.    Historical Provider, MD   . fluticasone  2 spray Each Nare Daily  . loratadine  10 mg Oral Daily  . multivitamin with minerals  1 tablet Oral Daily  . [START ON 08/20/2014] pantoprazole (PROTONIX) IV  40 mg Intravenous Q12H  . sodium chloride  3 mL Intravenous Q12H   PRN Meds clonazePAM, morphine injection, ondansetron **OR** ondansetron (ZOFRAN) IV Results for orders placed or performed during the hospital encounter of 08/16/14 (from the past 48 hour(s))  CBC  (at [facility])     Status: Abnormal   Collection Time: 08/16/14 11:00 PM  Result Value Ref Range   WBC 8.4 4.0 - 10.5 K/uL   RBC 3.73 (L) 4.22 - 5.81 MIL/uL   Hemoglobin 11.4 (L) 13.0 - 17.0 g/dL   HCT 35.2 (L) 39.0 - 52.0 %   MCV 94.4 78.0 - 100.0 fL   MCH 30.6 26.0 - 34.0 pg   MCHC 32.4 30.0 - 36.0 g/dL   RDW 12.5 11.5 - 15.5 %   Platelets 231 150 - 400 K/uL  Basic metabolic panel  (at [facility])     Status: Abnormal   Collection Time: 08/16/14 11:00 PM  Result Value Ref Range   Sodium 139 135 - 145 mmol/L   Potassium 4.1 3.5 - 5.1 mmol/L   Chloride 105 96 - 112 mmol/L   CO2 26 19 - 32 mmol/L   Glucose, Bld 161 (H) 70 - 99 mg/dL   BUN 35 (H) 6 - 23 mg/dL   Creatinine, Ser 1.07 0.50 - 1.35 mg/dL   Calcium 8.5 8.4 - 10.5 mg/dL   GFR calc non Af Amer 75 (L) >90 mL/min   GFR calc Af Amer 87 (L) >90 mL/min    Comment: (NOTE) The eGFR has been calculated using the CKD EPI equation. This calculation has not been validated in all clinical situations. eGFR's persistently <90 mL/min signify possible Chronic Kidney Disease.    Anion gap 8 5 - 15  I-Stat Troponin, ED - 0, 3, 6 hours (not at [facility])     Status: None   Collection Time: 08/16/14 11:01 PM  Result Value Ref Range   Troponin i, poc 0.00 0.00 - 0.08 ng/mL   Comment 3            Comment: Due to the release kinetics of cTnI, a negative result within the first hours of the  onset of symptoms does not rule out myocardial infarction with certainty. If myocardial infarction is still suspected, repeat the test at appropriate intervals.   CBG, ED     Status: None   Collection Time: 08/16/14 11:43 PM  Result Value Ref Range   Glucose-Capillary 89 70 - 99 mg/dL  Type and screen     Status: None   Collection Time: 08/17/14 12:24 AM  Result Value Ref Range   ABO/RH(D) B POS    Antibody Screen NEG    Sample Expiration 08/20/2014   ABO/Rh  Status: None   Collection Time: 08/17/14 12:24 AM  Result Value Ref Range   ABO/RH(D) B POS   I-stat troponin, ED     Status: None   Collection Time: 08/17/14 12:30 AM  Result Value Ref Range   Troponin i, poc 0.00 0.00 - 0.08 ng/mL   Comment 3            Comment: Due to the release kinetics of cTnI, a negative result within the first hours of the onset of symptoms does not rule out myocardial infarction with certainty. If myocardial infarction is still suspected, repeat the test at appropriate intervals.   Urine rapid drug screen (hosp performed)     Status: None   Collection Time: 08/17/14  2:26 AM  Result Value Ref Range   Opiates NONE DETECTED NONE DETECTED   Cocaine NONE DETECTED NONE DETECTED   Benzodiazepines NONE DETECTED NONE DETECTED   Amphetamines NONE DETECTED NONE DETECTED   Tetrahydrocannabinol NONE DETECTED NONE DETECTED   Barbiturates NONE DETECTED NONE DETECTED    Comment:        DRUG SCREEN FOR MEDICAL PURPOSES ONLY.  IF CONFIRMATION IS NEEDED FOR ANY PURPOSE, NOTIFY LAB WITHIN 5 DAYS.        LOWEST DETECTABLE LIMITS FOR URINE DRUG SCREEN Drug Class       Cutoff (ng/mL) Amphetamine      1000 Barbiturate      200 Benzodiazepine   161 Tricyclics       096 Opiates          300 Cocaine          300 THC              50   Lactic acid, plasma     Status: None   Collection Time: 08/17/14  3:48 AM  Result Value Ref Range   Lactic Acid, Venous 1.6 0.5 - 2.0 mmol/L  APTT     Status: Abnormal    Collection Time: 08/17/14  3:48 AM  Result Value Ref Range   aPTT 23 (L) 24 - 37 seconds  Protime-INR     Status: None   Collection Time: 08/17/14  3:48 AM  Result Value Ref Range   Prothrombin Time 14.7 11.6 - 15.2 seconds   INR 1.14 0.00 - 1.49  CBC     Status: Abnormal   Collection Time: 08/17/14  5:30 AM  Result Value Ref Range   WBC 9.3 4.0 - 10.5 K/uL   RBC 3.37 (L) 4.22 - 5.81 MIL/uL   Hemoglobin 10.4 (L) 13.0 - 17.0 g/dL   HCT 31.9 (L) 39.0 - 52.0 %   MCV 94.7 78.0 - 100.0 fL   MCH 30.9 26.0 - 34.0 pg   MCHC 32.6 30.0 - 36.0 g/dL   RDW 12.7 11.5 - 15.5 %   Platelets 228 150 - 400 K/uL  CBC     Status: Abnormal   Collection Time: 08/17/14  7:50 AM  Result Value Ref Range   WBC 7.2 4.0 - 10.5 K/uL   RBC 3.19 (L) 4.22 - 5.81 MIL/uL   Hemoglobin 9.8 (L) 13.0 - 17.0 g/dL   HCT 29.8 (L) 39.0 - 52.0 %   MCV 93.4 78.0 - 100.0 fL   MCH 30.7 26.0 - 34.0 pg   MCHC 32.9 30.0 - 36.0 g/dL   RDW 12.8 11.5 - 15.5 %   Platelets 218 150 - 400 K/uL  Comprehensive metabolic panel     Status: Abnormal   Collection  Time: 08/17/14  7:50 AM  Result Value Ref Range   Sodium 140 135 - 145 mmol/L   Potassium 4.1 3.5 - 5.1 mmol/L   Chloride 112 96 - 112 mmol/L   CO2 23 19 - 32 mmol/L   Glucose, Bld 80 70 - 99 mg/dL   BUN 24 (H) 6 - 23 mg/dL   Creatinine, Ser 0.86 0.50 - 1.35 mg/dL   Calcium 8.4 8.4 - 10.5 mg/dL   Total Protein 5.3 (L) 6.0 - 8.3 g/dL   Albumin 2.8 (L) 3.5 - 5.2 g/dL   AST 15 0 - 37 U/L   ALT 13 0 - 53 U/L   Alkaline Phosphatase 37 (L) 39 - 117 U/L   Total Bilirubin 0.6 0.3 - 1.2 mg/dL   GFR calc non Af Amer >90 >90 mL/min   GFR calc Af Amer >90 >90 mL/min    Comment: (NOTE) The eGFR has been calculated using the CKD EPI equation. This calculation has not been validated in all clinical situations. eGFR's persistently <90 mL/min signify possible Chronic Kidney Disease.    Anion gap 5 5 - 15    Ct Head Wo Contrast  08/17/2014   CLINICAL DATA:  Acute onset of  syncope. Incontinence. Diaphoresis and dizziness. Initial encounter.  EXAM: CT HEAD WITHOUT CONTRAST  TECHNIQUE: Contiguous axial images were obtained from the base of the skull through the vertex without intravenous contrast.  COMPARISON:  None.  FINDINGS: There is no evidence of acute infarction, mass lesion, or intra- or extra-axial hemorrhage on CT.  Mild periventricular white matter change likely reflects small vessel ischemic microangiopathy.  The posterior fossa, including the cerebellum, brainstem and fourth ventricle, is within normal limits. The third and lateral ventricles, and basal ganglia are unremarkable in appearance. The cerebral hemispheres are symmetric in appearance, with normal gray-white differentiation. No mass effect or midline shift is seen.  There is no evidence of fracture; visualized osseous structures are unremarkable in appearance. The visualized portions of the orbits are within normal limits. The paranasal sinuses and mastoid air cells are well-aerated. No significant soft tissue abnormalities are seen.  IMPRESSION: 1. No acute intracranial pathology seen on CT. 2. Mild small vessel ischemic microangiopathy noted.   Electronically Signed   By: Garald Balding M.D.   On: 08/17/2014 06:05               Blood pressure 108/69, pulse 85, temperature 98 F (36.7 C), temperature source Oral, resp. rate 16, height '5\' 6"'  (1.676 m), weight 70.4 kg (155 lb 3.3 oz), SpO2 100 %.  Physical exam:   General--African-American male in no acute distress  Neck--no lymphadenopathy Heart--regular rate and rhythm without murmurs or gallops  Lungs - clear  Abdomesoft and completely nontender with normal bowel sounds in no masses Psych - alert and oriented understands endoscopy    Assessment: 1. G.I. bleeding. Patient has elevated BUN and melena consistent with upper G.I. bleeding. I think he does need EGD.  2. Esophageal stricture. Last dilated 14 mm 10/15  3. History of colon  polyps/family history of colon cancer. Surveillance colonoscopy is up-to-date   Plan: we will proceed later this morning with EGD. Have discussed this with patient. He has had this done multiple times in the past and is familiar with the procedure. He is aware that Dr. Amedeo Plenty will be performing the procedure at some later point today.   Ammara Raj JR,Kyasia Steuck L 08/17/2014, 8:47 AM

## 2014-08-17 NOTE — Progress Notes (Addendum)
Patient ID: Kenneth Maynard, male   DOB: 23-Oct-1955, 59 y.o.   MRN: [id number] [facility] PROGRESS NOTE  Kenneth Maynard VHQ:[id number] DOB: February 06, 1956 DOA: 08/16/2014 PCP: Kennon Portela, MD  Brief narrative:    Addendum to admission note done today 08/17/2014  59 y.o. male with past medical history of hypertension, esophageal stricture (post status of dilation) who presented with syncope and dark stool. Patient apparently passed out the night prior to admission at 9:30 pm, he has had 2 BM dark color since past 24 hours PTA. When EMS arrived apparently his BP was 60/40.  In ED, patient was found to have hemoglobin drop from 14.8 on 05/12/14 to 11.4. Blood pressure was 89/58, normal troponin and 12 lead EKG similar to prior ones.  Gi has seen the pt in consultation. Plan is for EGD today.  Assessment/Plan:    Principal Problem: Syncope - Likely vasovagal from ? GI bleed - No acute findings on CT head - Pt reports feeling better this am  Active Problems: Upper GI bleed / Acute blood loss anemia  - Plan for endoscopy today - Appreciate GI input - Hemoglobin is 9.8 this am - Continue to monitor CBC  HTN (hypertension) - BP 113/73 without BP meds - Continue IV fluids for now    DVT Prophylaxis  - SCD's bilaterally    Code Status: Full.  Family Communication:  plan of care discussed with the patient Disposition Plan: needs EGD today  IV access:  Peripheral IV  Procedures and diagnostic studies:    Ct Head Wo Contrast 08/17/2014  1. No acute intracranial pathology seen on CT. 2. Mild small vessel ischemic microangiopathy noted.     Medical Consultants:  Gastroenterology   Other Consultants:  None   IAnti-Infectives:   None    Leisa Lenz, MD  [facility] Pager [phone]  If 7PM-7AM, please contact night-coverage [email or web address] Password [email or web address] 08/17/2014, 3:12 PM   LOS: 0 days    HPI/Subjective: No acute overnight  events.  Objective: Filed Vitals:   08/17/14 1014 08/17/14 1130 08/17/14 1140 08/17/14 1505  BP: 121/75 119/71 111/77 113/73  Pulse: 103 107 104 88  Temp: 98.2 F (36.8 C)     TempSrc: Oral Oral    Resp: 32 14 16 18   Height:      Weight:      SpO2: 100% 100% 100% 100%    Intake/Output Summary (Last 24 hours) at 08/17/14 1512 Last data filed at 08/17/14 1230  Gross per 24 hour  Intake    203 ml  Output    300 ml  Net    -97 ml    Exam:   General:  Pt is alert, follows commands appropriately, not in acute distress  Cardiovascular: Regular rate and rhythm, S1/S2, no murmurs  Respiratory: Clear to auscultation bilaterally, no wheezing, no crackles, no rhonchi  Abdomen: Soft, non tender, non distended, bowel sounds present  Extremities: No edema, pulses DP and PT palpable bilaterally  Neuro: Grossly nonfocal  Data Reviewed: Basic Metabolic Panel:  Recent Labs Lab 08/16/14 2300 08/17/14 0750  NA 139 140  K 4.1 4.1  CL 105 112  CO2 26 23  GLUCOSE 161* 80  BUN 35* 24*  CREATININE 1.07 0.86  CALCIUM 8.5 8.4   Liver Function Tests:  Recent Labs Lab 08/17/14 0750  AST 15  ALT 13  ALKPHOS 37*  BILITOT 0.6  PROT 5.3*  ALBUMIN 2.8*   No results for input(s): LIPASE, AMYLASE in the last 168  hours. No results for input(s): AMMONIA in the last 168 hours. CBC:  Recent Labs Lab 08/16/14 2300 08/17/14 0530 08/17/14 0750  WBC 8.4 9.3 7.2  HGB 11.4* 10.4* 9.8*  HCT 35.2* 31.9* 29.8*  MCV 94.4 94.7 93.4  PLT 231 228 218   Cardiac Enzymes: No results for input(s): CKTOTAL, CKMB, CKMBINDEX, TROPONINI in the last 168 hours. BNP: Invalid input(s): POCBNP CBG:  Recent Labs Lab 08/16/14 2343 08/17/14 0940 08/17/14 1018  GLUCAP 89 54* 135*    No results found for this or any previous visit (from the past 240 hour(s)).   Scheduled Meds: . dextrose      . fluticasone  2 spray Each Nare Daily  . loratadine  10 mg Oral Daily  . multivitamin with  minerals  1 tablet Oral Daily  . [START ON 08/20/2014] pantoprazole (PROTONIX) IV  40 mg Intravenous Q12H  . sodium chloride  3 mL Intravenous Q12H   Continuous Infusions: . sodium chloride 125 mL/hr at 08/17/14 1318  . pantoprozole (PROTONIX) infusion 8 mg/hr (08/17/14 0328)

## 2014-08-17 NOTE — Progress Notes (Signed)
Utilization review completed.  

## 2014-08-17 NOTE — ED Notes (Signed)
hospitalist at the bedside 

## 2014-08-18 ENCOUNTER — Encounter (HOSPITAL_COMMUNITY): Payer: Self-pay | Admitting: Gastroenterology

## 2014-08-18 ENCOUNTER — Inpatient Hospital Stay (HOSPITAL_COMMUNITY): Payer: 59

## 2014-08-18 LAB — CBC
HCT: 27.3 % — ABNORMAL LOW (ref 39.0–52.0)
HCT: 29.2 % — ABNORMAL LOW (ref 39.0–52.0)
HCT: 29.2 % — ABNORMAL LOW (ref 39.0–52.0)
HCT: 28.7 % — ABNORMAL LOW (ref 39.0–52.0)
HEMOGLOBIN: 8.9 g/dL — AB (ref 13.0–17.0)
Hemoglobin: 9.6 g/dL — ABNORMAL LOW (ref 13.0–17.0)
Hemoglobin: 9.8 g/dL — ABNORMAL LOW (ref 13.0–17.0)
Hemoglobin: 9.5 g/dL — ABNORMAL LOW (ref 13.0–17.0)
MCH: 30.5 pg (ref 26.0–34.0)
MCH: 31.3 pg (ref 26.0–34.0)
MCH: 31.5 pg (ref 26.0–34.0)
MCH: 30.8 pg (ref 26.0–34.0)
MCHC: 32.6 g/dL (ref 30.0–36.0)
MCHC: 32.9 g/dL (ref 30.0–36.0)
MCHC: 33.6 g/dL (ref 30.0–36.0)
MCHC: 33.1 g/dL (ref 30.0–36.0)
MCV: 93.5 fL (ref 78.0–100.0)
MCV: 95.1 fL (ref 78.0–100.0)
MCV: 93.9 fL (ref 78.0–100.0)
MCV: 93.2 fL (ref 78.0–100.0)
PLATELETS: 228 10*3/uL (ref 150–400)
Platelets: 206 10*3/uL (ref 150–400)
Platelets: 216 10*3/uL (ref 150–400)
Platelets: 230 10*3/uL (ref 150–400)
RBC: 2.92 MIL/uL — AB (ref 4.22–5.81)
RBC: 3.07 MIL/uL — ABNORMAL LOW (ref 4.22–5.81)
RBC: 3.11 MIL/uL — ABNORMAL LOW (ref 4.22–5.81)
RBC: 3.08 MIL/uL — ABNORMAL LOW (ref 4.22–5.81)
RDW: 12.9 % (ref 11.5–15.5)
RDW: 12.8 % (ref 11.5–15.5)
RDW: 12.8 % (ref 11.5–15.5)
RDW: 12.7 % (ref 11.5–15.5)
WBC: 6.2 10*3/uL (ref 4.0–10.5)
WBC: 8.6 10*3/uL (ref 4.0–10.5)
WBC: 8.9 10*3/uL (ref 4.0–10.5)
WBC: 7.1 10*3/uL (ref 4.0–10.5)

## 2014-08-18 LAB — HIV ANTIBODY (ROUTINE TESTING W REFLEX): HIV Screen 4th Generation wRfx: NONREACTIVE

## 2014-08-18 LAB — GLUCOSE, CAPILLARY: GLUCOSE-CAPILLARY: 71 mg/dL (ref 70–99)

## 2014-08-18 MED ORDER — FENTANYL CITRATE 0.05 MG/ML IJ SOLN: 25.0000 ug | INTRAMUSCULAR | Status: DC | PRN

## 2014-08-18 MED ORDER — PROMETHAZINE HCL 25 MG/ML IJ SOLN
6.2500 mg | INTRAMUSCULAR | Status: DC | PRN
Start: 2014-08-18 — End: 2014-08-19

## 2014-08-18 MED ORDER — PROMETHAZINE HCL 25 MG/ML IJ SOLN: 6.2500 mg | INTRAMUSCULAR | Status: DC | PRN

## 2014-08-18 MED ORDER — MEPERIDINE HCL 25 MG/ML IJ SOLN: 6.2500 mg | INTRAMUSCULAR | Status: DC | PRN

## 2014-08-18 MED ORDER — SODIUM CHLORIDE 0.9 % IV SOLN
INTRAVENOUS | Status: DC
Start: 2014-08-18 — End: 2014-08-19
  Administered 2014-08-18: 20:00:00 via INTRAVENOUS

## 2014-08-18 MED ORDER — FENTANYL CITRATE 0.05 MG/ML IJ SOLN
25.0000 ug | INTRAMUSCULAR | Status: DC | PRN
  Administered 2014-08-19: 50 ug via INTRAVENOUS

## 2014-08-18 NOTE — Progress Notes (Signed)
[facility]                                                                              Patient Demographics  Kenneth Maynard, is a 59 y.o. male, DOB - 04-21-1956, DJT:[id number]  Admit date - 08/16/2014   Admitting Physician Ivor Costa, MD  Outpatient Primary MD for the patient is GUEST, Veneda Melter, MD  LOS - 1   Chief Complaint  Patient presents with  . Loss of Consciousness  . Diarrhea  . Emesis      Interim history 59 year old male with history of hypertension, allergies, history of GI bleed 35 years ago presented with complaints of syncope and dark stool. Patient was noted to have a hemoglobin of 9.6 however his hemoglobin at baseline is 14.  Patient was noted to have hypotension upon admission. Syncope likely secondary to vasovagal from GI bleed. Gastroenterology consulted, EGD attempted however unable to pass scope due to esophageal stricture. Barium swallow ordered and pending.  Assessment & Plan   Syncope -Likely vasovagal from GI bleed -CT head shows no acute findings -Patient feeling better this morning, denies any dizziness or lightheadedness  Upper GI bleed/acute blood loss anemia -Gastric urology consulted and appreciate -EGD attempted on 08/17/2014 however unable to pass scope due to esophageal stricture -Barium swallow ordered -Patient sees Dr. Earle Gell, and would like for him to be consulted. -Hemoglobin currently 8.9 -Continue Protonix  Hypertension -Lisinopril HCTZ currently held as patient had soft blood pressure upon admission -Pressure currently stable on no medications.  Code Status: full  Family Communication:  None at bedside  Disposition Plan:  Admitted, pending further workup  Time Spent in minutes   30 minutes  Procedures  EGD attempted Barium swallow  Consults   gastroenterology  DVT Prophylaxis  SCDs  Lab Results  Component Value Date   PLT 216 08/18/2014    Medications  Scheduled Meds: . fluticasone  2  spray Each Nare Daily  . loratadine  10 mg Oral Daily  . multivitamin with minerals  1 tablet Oral Daily  . [START ON 08/20/2014] pantoprazole (PROTONIX) IV  40 mg Intravenous Q12H  . sodium chloride  3 mL Intravenous Q12H   Continuous Infusions: . sodium chloride 75 mL/hr at 08/17/14 1513  . pantoprozole (PROTONIX) infusion 8 mg/hr (08/17/14 1539)   PRN Meds:.clonazePAM, morphine injection, ondansetron **OR** ondansetron (ZOFRAN) IV  Antibiotics    Anti-infectives    None        Subjective:   Kenneth Maynard seen and examined today.  Patient states he is feeling better, denies any headache or lightheadedness or dizziness. Denies any chest pain or shortness of breath. Patient denies any abdominal pain. Denies any further dark stools. Patient does wish to have his gastroenterologist, Dr. Earle Gell to see him.    Objective:   Filed Vitals:   08/17/14 1140 08/17/14 1505 08/17/14 2055 08/18/14 0504  BP: 111/77 113/73 128/79 124/70  Pulse: 104 88 102 101  Temp:   98.6 F (37 C) 98.2 F (36.8 C)  TempSrc:   Oral Oral  Resp: 16 18 18 18   Height:      Weight:      SpO2: 100%  100% 100% 100%    Wt Readings from Last 3 Encounters:  08/17/14 70.4 kg (155 lb 3.3 oz)  05/12/14 67.858 kg (149 lb 9.6 oz)  02/24/14 67.132 kg (148 lb)     Intake/Output Summary (Last 24 hours) at 08/18/14 1308 Last data filed at 08/18/14 1100  Gross per 24 hour  Intake    360 ml  Output   2000 ml  Net  -1640 ml    Exam  General: Well developed, well nourished, NAD, appears stated age  HEENT: NCAT, mucous membranes moist.   Cardiovascular: S1 S2 auscultated, no rubs, murmurs or gallops. Regular rate and rhythm.  Respiratory: Clear to auscultation bilaterally with equal chest rise  Abdomen: Soft, nontender, nondistended, + bowel sounds  Extremities: warm dry without cyanosis clubbing or edema  Neuro: AAOx3, nonfocal  Psych: Appropriate  Data Review   Micro Results No  results found for this or any previous visit (from the past 240 hour(s)).  Radiology Reports Ct Head Wo Contrast  08/17/2014   CLINICAL DATA:  Acute onset of syncope. Incontinence. Diaphoresis and dizziness. Initial encounter.  EXAM: CT HEAD WITHOUT CONTRAST  TECHNIQUE: Contiguous axial images were obtained from the base of the skull through the vertex without intravenous contrast.  COMPARISON:  None.  FINDINGS: There is no evidence of acute infarction, mass lesion, or intra- or extra-axial hemorrhage on CT.  Mild periventricular white matter change likely reflects small vessel ischemic microangiopathy.  The posterior fossa, including the cerebellum, brainstem and fourth ventricle, is within normal limits. The third and lateral ventricles, and basal ganglia are unremarkable in appearance. The cerebral hemispheres are symmetric in appearance, with normal gray-white differentiation. No mass effect or midline shift is seen.  There is no evidence of fracture; visualized osseous structures are unremarkable in appearance. The visualized portions of the orbits are within normal limits. The paranasal sinuses and mastoid air cells are well-aerated. No significant soft tissue abnormalities are seen.  IMPRESSION: 1. No acute intracranial pathology seen on CT. 2. Mild small vessel ischemic microangiopathy noted.   Electronically Signed   By: Garald Balding M.D.   On: 08/17/2014 06:05    CBC  Recent Labs Lab 08/17/14 0750 08/17/14 1412 08/17/14 1933 08/18/14 0140 08/18/14 0739  WBC 7.2 5.6 6.7 6.2 8.6  HGB 9.8* 9.7* 9.6* 8.9* 9.6*  HCT 29.8* 29.3* 29.1* 27.3* 29.2*  PLT 218 210 151 206 216  MCV 93.4 93.9 94.5 93.5 95.1  MCH 30.7 31.1 31.2 30.5 31.3  MCHC 32.9 33.1 33.0 32.6 32.9  RDW 12.8 12.9 12.9 12.9 12.8    Chemistries   Recent Labs Lab 08/16/14 2300 08/17/14 0750  NA 139 140  K 4.1 4.1  CL 105 112  CO2 26 23  GLUCOSE 161* 80  BUN 35* 24*  CREATININE 1.07 0.86  CALCIUM 8.5 8.4  AST  --   15  ALT  --  13  ALKPHOS  --  37*  BILITOT  --  0.6   ------------------------------------------------------------------------------------------------------------------ estimated creatinine clearance is 84.5 mL/min (by C-G formula based on Cr of 0.86). ------------------------------------------------------------------------------------------------------------------ No results for input(s): HGBA1C in the last 72 hours. ------------------------------------------------------------------------------------------------------------------ No results for input(s): CHOL, HDL, LDLCALC, TRIG, CHOLHDL, LDLDIRECT in the last 72 hours. ------------------------------------------------------------------------------------------------------------------ No results for input(s): TSH, T4TOTAL, T3FREE, THYROIDAB in the last 72 hours.  Invalid input(s): FREET3 ------------------------------------------------------------------------------------------------------------------ No results for input(s): VITAMINB12, FOLATE, FERRITIN, TIBC, IRON, RETICCTPCT in the last 72 hours.  Coagulation profile  Recent Labs Lab 08/17/14 0348  INR 1.14    No results for input(s): DDIMER in the last 72 hours.  Cardiac Enzymes No results for input(s): CKMB, TROPONINI, MYOGLOBIN in the last 168 hours.  Invalid input(s): CK ------------------------------------------------------------------------------------------------------------------ Invalid input(s): POCBNP    Asaiah Hunnicutt D.O. on 08/18/2014 at 1:08 PM  Between 7am to 7pm - Pager - [phone]  After 7pm go to [email or web address] - password [email or web address]  And look for the night coverage person covering for me after hours  [facility] Office  [phone]

## 2014-08-18 NOTE — Progress Notes (Signed)
[facility] Progress Note  Subjective: Stool since yesterday, no vomiting or abdominal pain  Objective: Vital signs in last 24 hours: Temp:  [98.2 F (36.8 C)-98.6 F (37 C)] 98.2 F (36.8 C) (03/29 0504) Pulse Rate:  [88-107] 101 (03/29 0504) Resp:  [14-32] 18 (03/29 0504) BP: (108-128)/(58-79) 124/70 mmHg (03/29 0504) SpO2:  [99 %-100 %] 100 % (03/29 0504) Weight change:    PE: Abdomen soft  Lab Results: Results for orders placed or performed during the hospital encounter of 08/16/14 (from the past 24 hour(s))  Glucose, capillary     Status: Abnormal   Collection Time: 08/17/14  9:40 AM  Result Value Ref Range   Glucose-Capillary 54 (L) 70 - 99 mg/dL  Glucose, capillary     Status: Abnormal   Collection Time: 08/17/14 10:18 AM  Result Value Ref Range   Glucose-Capillary 135 (H) 70 - 99 mg/dL  CBC     Status: Abnormal   Collection Time: 08/17/14  2:12 PM  Result Value Ref Range   WBC 5.6 4.0 - 10.5 K/uL   RBC 3.12 (L) 4.22 - 5.81 MIL/uL   Hemoglobin 9.7 (L) 13.0 - 17.0 g/dL   HCT 29.3 (L) 39.0 - 52.0 %   MCV 93.9 78.0 - 100.0 fL   MCH 31.1 26.0 - 34.0 pg   MCHC 33.1 30.0 - 36.0 g/dL   RDW 12.9 11.5 - 15.5 %   Platelets 210 150 - 400 K/uL  CBC     Status: Abnormal   Collection Time: 08/17/14  7:33 PM  Result Value Ref Range   WBC 6.7 4.0 - 10.5 K/uL   RBC 3.08 (L) 4.22 - 5.81 MIL/uL   Hemoglobin 9.6 (L) 13.0 - 17.0 g/dL   HCT 29.1 (L) 39.0 - 52.0 %   MCV 94.5 78.0 - 100.0 fL   MCH 31.2 26.0 - 34.0 pg   MCHC 33.0 30.0 - 36.0 g/dL   RDW 12.9 11.5 - 15.5 %   Platelets 151 150 - 400 K/uL  CBC     Status: Abnormal   Collection Time: 08/18/14  1:40 AM  Result Value Ref Range   WBC 6.2 4.0 - 10.5 K/uL   RBC 2.92 (L) 4.22 - 5.81 MIL/uL   Hemoglobin 8.9 (L) 13.0 - 17.0 g/dL   HCT 27.3 (L) 39.0 - 52.0 %   MCV 93.5 78.0 - 100.0 fL   MCH 30.5 26.0 - 34.0 pg   MCHC 32.6 30.0 - 36.0 g/dL   RDW 12.9 11.5 - 15.5 %   Platelets 206 150 - 400 K/uL  Glucose,  capillary     Status: None   Collection Time: 08/18/14  5:46 AM  Result Value Ref Range   Glucose-Capillary 71 70 - 99 mg/dL  CBC     Status: Abnormal   Collection Time: 08/18/14  7:39 AM  Result Value Ref Range   WBC 8.6 4.0 - 10.5 K/uL   RBC 3.07 (L) 4.22 - 5.81 MIL/uL   Hemoglobin 9.6 (L) 13.0 - 17.0 g/dL   HCT 29.2 (L) 39.0 - 52.0 %   MCV 95.1 78.0 - 100.0 fL   MCH 31.3 26.0 - 34.0 pg   MCHC 32.9 30.0 - 36.0 g/dL   RDW 12.8 11.5 - 15.5 %   Platelets 216 150 - 400 K/uL    Studies/Results: Ct Head Wo Contrast  08/17/2014   CLINICAL DATA:  Acute onset of syncope. Incontinence. Diaphoresis and dizziness. Initial encounter.  EXAM: CT HEAD WITHOUT CONTRAST  TECHNIQUE: Contiguous axial images were obtained from the base of the skull through the vertex without intravenous contrast.  COMPARISON:  None.  FINDINGS: There is no evidence of acute infarction, mass lesion, or intra- or extra-axial hemorrhage on CT.  Mild periventricular white matter change likely reflects small vessel ischemic microangiopathy.  The posterior fossa, including the cerebellum, brainstem and fourth ventricle, is within normal limits. The third and lateral ventricles, and basal ganglia are unremarkable in appearance. The cerebral hemispheres are symmetric in appearance, with normal gray-white differentiation. No mass effect or midline shift is seen.  There is no evidence of fracture; visualized osseous structures are unremarkable in appearance. The visualized portions of the orbits are within normal limits. The paranasal sinuses and mastoid air cells are well-aerated. No significant soft tissue abnormalities are seen.  IMPRESSION: 1. No acute intracranial pathology seen on CT. 2. Mild small vessel ischemic microangiopathy noted.   Electronically Signed   By: Garald Balding M.D.   On: 08/17/2014 06:05      Assessment: Presumed upper GI bleeding, unable to pass scope passed the hypopharynx due to very tight stricture,  although no dysphagia currently  Plan: Barium swallow upper GI today Continue proton pump inhibitor Monitor stools and hemoglobin Clear liquid diet     Bennie Scaff C 08/18/2014, 8:46 AM

## 2014-08-19 ENCOUNTER — Encounter (HOSPITAL_COMMUNITY)
Admission: EM | Disposition: A | Discharge status: Home / Self Care | Payer: Self-pay | Source: Home / Self Care | Attending: Internal Medicine

## 2014-08-19 ENCOUNTER — Inpatient Hospital Stay (HOSPITAL_COMMUNITY): Payer: 59 | Admitting: Anesthesiology

## 2014-08-19 ENCOUNTER — Encounter (HOSPITAL_COMMUNITY): Payer: Self-pay | Admitting: *Deleted

## 2014-08-19 ENCOUNTER — Inpatient Hospital Stay (HOSPITAL_COMMUNITY): Payer: 59

## 2014-08-19 DIAGNOSIS — K264 Chronic or unspecified duodenal ulcer with hemorrhage: Secondary | ICD-10-CM

## 2014-08-19 DIAGNOSIS — K222 Esophageal obstruction: Secondary | ICD-10-CM

## 2014-08-19 HISTORY — PX: SAVORY DILATION: SHX5439

## 2014-08-19 HISTORY — PX: ESOPHAGOGASTRODUODENOSCOPY: SHX5428

## 2014-08-19 LAB — BASIC METABOLIC PANEL
ANION GAP: 5 (ref 5–15)
BUN: 5 mg/dL — ABNORMAL LOW (ref 6–23)
CALCIUM: 8.7 mg/dL (ref 8.4–10.5)
CHLORIDE: 109 mmol/L (ref 96–112)
CO2: 26 mmol/L (ref 19–32)
Creatinine, Ser: 0.89 mg/dL (ref 0.50–1.35)
GFR calc Af Amer: >90 mL/min (ref >90)
GFR calc non Af Amer: >90 mL/min (ref >90)
GLUCOSE: 88 mg/dL (ref 70–99)
POTASSIUM: 3.8 mmol/L (ref 3.5–5.1)
SODIUM: 140 mmol/L (ref 135–145)

## 2014-08-19 LAB — CBC
HCT: 28.2 % — ABNORMAL LOW (ref 39.0–52.0)
Hemoglobin: 9.3 g/dL — ABNORMAL LOW (ref 13.0–17.0)
MCH: 30.9 pg (ref 26.0–34.0)
MCHC: 33 g/dL (ref 30.0–36.0)
MCV: 93.7 fL (ref 78.0–100.0)
Platelets: 230 10*3/uL (ref 150–400)
RBC: 3.01 MIL/uL — ABNORMAL LOW (ref 4.22–5.81)
RDW: 12.7 % (ref 11.5–15.5)
WBC: 6.2 10*3/uL (ref 4.0–10.5)

## 2014-08-19 LAB — GLUCOSE, CAPILLARY: GLUCOSE-CAPILLARY: 82 mg/dL (ref 70–99)

## 2014-08-19 SURGERY — EGD (ESOPHAGOGASTRODUODENOSCOPY)
Anesthesia: Monitor Anesthesia Care

## 2014-08-19 MED ORDER — CETYLPYRIDINIUM CHLORIDE 0.05 % MT LIQD
7.0000 mL | Freq: Every day | OROMUCOSAL | Status: DC
  Administered 2014-08-19: 7 mL via OROMUCOSAL

## 2014-08-19 MED ORDER — BUTAMBEN-TETRACAINE-BENZOCAINE 2-2-14 % EX AERO
INHALATION_SPRAY | CUTANEOUS | Status: DC | PRN
  Administered 2014-08-19: 1 via TOPICAL

## 2014-08-19 MED ORDER — LACTATED RINGERS IV SOLN
INTRAVENOUS | Status: DC
  Administered 2014-08-19: 12:00:00 via INTRAVENOUS

## 2014-08-19 MED ORDER — MIDAZOLAM HCL 5 MG/5ML IJ SOLN
INTRAMUSCULAR | Status: DC | PRN
  Administered 2014-08-19: 2 mg via INTRAVENOUS

## 2014-08-19 MED ORDER — PANTOPRAZOLE SODIUM 40 MG PO TBEC: 40.0000 mg | DELAYED_RELEASE_TABLET | Freq: Every day | ORAL | Status: DC

## 2014-08-19 MED ORDER — PROPOFOL INFUSION 10 MG/ML OPTIME
INTRAVENOUS | Status: DC | PRN
  Administered 2014-08-19: 100 ug/kg/min via INTRAVENOUS

## 2014-08-19 MED ORDER — PROPOFOL 10 MG/ML IV BOLUS
INTRAVENOUS | Status: DC | PRN
  Administered 2014-08-19: 20 mg via INTRAVENOUS

## 2014-08-19 NOTE — Transfer of Care (Signed)
Immediate Anesthesia Transfer of Care Note  Patient: Kenneth Maynard  Procedure(s) Performed: Procedure(s): ESOPHAGOGASTRODUODENOSCOPY (EGD) (N/A) SAVORY DILATION (N/A)  Patient Location: PACU and Endoscopy Unit  Anesthesia Type:MAC  Level of Consciousness: awake, alert  and oriented  Airway & Oxygen Therapy: Patient Spontanous Breathing and Patient connected to nasal cannula oxygen  Post-op Assessment: Report given to RN and Post -op Vital signs reviewed and stable  Post vital signs: Reviewed and stable  Last Vitals:  Filed Vitals:   08/19/14 1243  BP: 151/90  Pulse: 88  Temp: 36.4 C  Resp: 14    Complications: No apparent anesthesia complications

## 2014-08-19 NOTE — Progress Notes (Signed)
Orders for discharge placed by MD. Reviewed with patient and instructed on new medication-protonix. Patient and wife aware of instructions and follow up appointments to be made. All questions answered. IV removed. No tele on patient. Patient taken out via w/c with belongings.

## 2014-08-19 NOTE — Progress Notes (Signed)
PATIENT HAS RESTED INTERMITTENTLY OVERNIGHT. OFFERED KLONOPIN AS SLEEP AID--REFUSED. NO C/O PAIN. NPO FOR EGD WITH DILATION TODAY.  PROTONIX DRIP AT 25. NS AT 75.       INSTRUCTED TO CALL FOR ASSISTANCE WHEN NEEDED.

## 2014-08-19 NOTE — Anesthesia Postprocedure Evaluation (Signed)
  Anesthesia Post-op Note  Patient: Kenneth Maynard  Procedure(s) Performed: Procedure(s): ESOPHAGOGASTRODUODENOSCOPY (EGD) (N/A) SAVORY DILATION (N/A)  Patient Location: PACU  Anesthesia Type:MAC  Level of Consciousness: awake and alert   Airway and Oxygen Therapy: Patient Spontanous Breathing  Post-op Pain: none  Post-op Assessment: Post-op Vital signs reviewed, Patient's Cardiovascular Status Stable and Respiratory Function Stable  Post-op Vital Signs: Reviewed and stable  Last Vitals:  Filed Vitals:   08/19/14 1257  BP: 136/86  Pulse: 87  Temp:   Resp: 18    Complications: No apparent anesthesia complications

## 2014-08-19 NOTE — Anesthesia Preprocedure Evaluation (Addendum)
Anesthesia Evaluation  Patient identified by MRN, date of birth, ID band Patient awake    Reviewed: Allergy & Precautions, NPO status , Patient's Chart, lab work & pertinent test results  Airway Mallampati: II       Dental  (+) Dental Advisory Given   Pulmonary  breath sounds clear to auscultation        Cardiovascular hypertension, Pt. on medications Rhythm:Regular     Neuro/Psych    GI/Hepatic   Endo/Other    Renal/GU      Musculoskeletal   Abdominal (+)  Abdomen: soft.    Peds  Hematology   Anesthesia Other Findings   Reproductive/Obstetrics                          Anesthesia Physical Anesthesia Plan  ASA: II  Anesthesia Plan: MAC   Post-op Pain Management:    Induction: Intravenous  Airway Management Planned: Nasal Cannula  Additional Equipment:   Intra-op Plan:   Post-operative Plan:   Informed Consent: I have reviewed the patients History and Physical, chart, labs and discussed the procedure including the risks, benefits and alternatives for the proposed anesthesia with the patient or authorized representative who has indicated his/her understanding and acceptance.     Plan Discussed with:   Anesthesia Plan Comments:         Anesthesia Quick Evaluation                                  Anesthesia Evaluation  Patient identified by MRN, date of birth, ID band Patient awake    Reviewed: Allergy & Precautions, NPO status , Patient's Chart, lab work & pertinent test results  Airway Mallampati: II  TM Distance: >3 FB Neck ROM: Full    Dental no notable dental hx.    Pulmonary neg pulmonary ROS,  breath sounds clear to auscultation  Pulmonary exam normal       Cardiovascular hypertension, Pt. on medications Rhythm:Regular Rate:Normal  EKG unchanged   Neuro/Psych Anxiety negative neurological ROS  negative psych ROS    GI/Hepatic negative GI ROS, Neg liver ROS, GI Bleed, SP esophageal dilation 02/2014   Endo/Other  negative endocrine ROS  Renal/GU negative Renal ROS  negative genitourinary   Musculoskeletal negative musculoskeletal ROS (+)   Abdominal   Peds negative pediatric ROS (+)  Hematology  (+) anemia , 9/30 acute drop    Anesthesia Other Findings   Reproductive/Obstetrics negative OB ROS                           Anesthesia Physical Anesthesia Plan  ASA: III  Anesthesia Plan: MAC   Post-op Pain Management:    Induction: Intravenous  Airway Management Planned: Nasal Cannula and Simple Face Mask  Additional Equipment:   Intra-op Plan:   Post-operative Plan:   Informed Consent: I have reviewed the patients History and Physical, chart, labs and discussed the procedure including the risks, benefits and alternatives for the proposed anesthesia with the patient or authorized representative who has indicated his/her understanding and acceptance.   Dental advisory given  Plan Discussed with: CRNA and Surgeon  Anesthesia Plan Comments:        Anesthesia Quick Evaluation

## 2014-08-19 NOTE — Anesthesia Procedure Notes (Signed)
Procedure Name: MAC Date/Time: 04/30/2015 12:09 PM Performed by: Eligha Bridegroom Pre-anesthesia Checklist: Patient identified, Emergency Drugs available, Suction available, Patient being monitored and Timeout performed Patient Re-evaluated:Patient Re-evaluated prior to inductionOxygen Delivery Method: Nasal cannula Preoxygenation: Pre-oxygenation with 100% oxygen Intubation Type: IV induction

## 2014-08-19 NOTE — Op Note (Signed)
Problems: Upper gastrointestinal bleeding on aspirin 81 mg daily. Benign cervical esophageal stricture. 02/19/2014 normal surveillance colonoscopy. Personal history of adenomatous colon polyps removed colonoscopically in the past. Father diagnosed with colon cancer at age 59  Endoscopist: Earle Gell  Premedication: Propofol administered by anesthesia  Procedure: Diagnostic esophagogastroduodenoscopy, benign cervical esophageal stricture dilation with Savary dilators, gastric biopsies to rule out H. pylori gastritis  The patient was placed in the left lateral decubitus position. The Pentax gastroscope was passed through the hypopharynx into the proximal esophagus without difficulty. The hypopharynx, larynx, and vocal cords appeared normal.  Esophagoscopy: In the proximal cervical esophagus was a benign esophageal stricture which could not be traversed with the Pentax gastroscope. Under fluoroscopic guidance, the Savary dilator wire was passed and the tip of the guidewire advanced to the distal gastric antrum as confirmed fluoroscopically. The 9 mm, 10 mm, 11 mm, and 12 mm Savary dilators were passed without significant resistance. The Savary dilator wire was removed. The Pentax gastroscope was passed through the hypopharynx into the esophagus without difficulty. Post-esophageal dilation, I was easily able to traverse the length of the esophagus with the Pentax gastroscope. The cervical esophageal stricture was satisfactory dilated without apparent complications. The squamocolumnar junction was regular in appearance and noted at 40 cm from the incisor teeth. The mid and distal esophageal mucosa appeared normal.  Gastroscopy: Retroflex view of the gastric cardia and fundus were normal. The gastric body, antrum, and pylorus appeared. Biopsies were taken from the gastric antrum, gastric body, and gastric cardia to look for H. pylori gastritis.  Duodenoscopy: In the mid duodenal bulb was a nonbleeding  small duodenal ulcer with exudative base. There was no stigmata to suggest increased risk for recurrent bleeding. The descending duodenum appeared normal.  Assessment:  #1. Benign cervical esophageal stricture dilated from 9 mm to 12 mm using the Savary esophageal dilators  #2. Nonbleeding duodenal bulb ulcer at low risk for recurrent bleeding  #3. Gastric biopsies to rule out H. pylori gastritis.  Recommendation:  #1. Discontinue aspirin and all nonsteroidal anti-inflammatory medication  #2. Resume regular diet  #3. Take a proton pump inhibitor each morning for 6 weeks to heal the duodenal bulb ulcer  #4. Check gastric biopsies to look for H. pylori gastritis and treat the infection if present  #5. The patient can be discharged from the hospital

## 2014-08-19 NOTE — Progress Notes (Signed)
Patient has eaten 80% lunch and 1 hour later denies pain, n/v. MD pg'd to notify and ask about discharge.

## 2014-08-19 NOTE — Discharge Summary (Signed)
Discharge Summary  Kenneth Maynard NLZ:[id number] DOB: 08-Mar-1956  PCP: Kennon Portela, MD  Admit date: 08/16/2014 Discharge date: 08/19/2014  Time spent: < 34mins  Recommendations for Outpatient Follow-up:  1. F/u with PMD in two weeks, pmd to repeat cbc. 2. F/u with GI, f/u with Hpylori result.  Discharge Diagnoses:  Active Hospital Problems   Diagnosis Date Noted  . Syncope 08/17/2014  . GIB (gastrointestinal bleeding) 08/17/2014  . Upper GI bleed   . Acute blood loss anemia   . Essential hypertension   . HTN (hypertension) 12/11/2011    Resolved Hospital Problems   Diagnosis Date Noted Date Resolved  . Allergy 08/17/2014 08/17/2014  . Anxiety 12/11/2011 08/17/2014    Discharge Condition: stable  Diet recommendation: heart healthy  Filed Weights   08/16/14 2249 08/17/14 0138  Weight: 68.04 kg (150 lb) 70.4 kg (155 lb 3.3 oz)    [name] is a 59 y.o. male with past medical history of hypertension, allergy, insomnia, history of a GI bleed in 35 years ago (he is not sure whether he had peptic ulcer), anxiety, esophageal stricture (post status of dilation), who presents with syncope and dark stool.  Patient reports that he passed out when he got up to the bathroom at 9:30 PM, which lasted for about 1 or 2 min. He lost control of bowel movement. Patient had mild dizziness, but no chest pain, palpitation, shortness of breath, unilateral weakness or seizure activity. He had a mild lip cut, but no obvious head injury. EMS reports that ptient was diaphoretic with BP 60/40 on EMS arrival. Pt vomiting vomited x 1 on the way to ED. Patient reports that he had 2 episode of dark stool yesterday. He has epigastric discomfort. He is currently taking aspirin, and occasionally takes ibuprofen. He has history of GI bleeding 35 years ago, but not very sure whether he had peptic ulcer. He had a colonoscopy on 02/2014, and had a small polyp removed. He also had a EGD on 10/60/15,  which showed s/p of esophageal stricture dilation and hiatal hernia. Patient denies fever, chills, cough, chest pain, SOB, dysuria, urgency, frequency, hematuria, skin rashes, joint pain or leg swelling. No unilateral weakness, numbness or tingling sensations. No vision change or hearing loss.  In ED, patient was found to have hemoglobin drop from 14.8 on 05/12/14 to 11.4. Blood pressure soft, negative troponin, electrolytes okay, but with high BUN/creatinine ratio (35/1.07). EKG showed small Q wave in aVL and slight ST elevation V2, which is not new and existed in previous EKG on 04/24/11. Patient is admitted to inpatient of further evaluation and treatment. GI was consulted by ED.    Hospital Course:  Principal Problem:   Syncope Active Problems:   HTN (hypertension)   GIB (gastrointestinal bleeding)   Upper GI bleed   Acute blood loss anemia   Essential hypertension GI bleed: no prbc transfusion required, egd 3/30 non bleeding duodenal ulcer. Received ppi drip in the hospital. #1. Benign cervical esophageal stricture dilated from 9 mm to 12 mm using the Savary esophageal dilators  #2. Nonbleeding duodenal bulb ulcer at low risk for recurrent bleeding  #3. Gastric biopsies to rule out H. pylori gastritis.  Recommendation:  #1. Discontinue aspirin and all nonsteroidal anti-inflammatory medication  #2. Resume regular diet  #3. Take a proton pump inhibitor each morning for 6 weeks to heal the duodenal bulb ulcer  #4. Check gastric biopsies to look for H. pylori gastritis and treat the infection if present  Syncope:  likely hypovolemic due to dehydration and chronic blood loss. S/p ivf, resolved. Tele no arrhythemia. EKG no acute findings, Cardiac enzymes negative.  HTN; bp meds on hold initially, resumed at discharge.  H/o anxiety: stable on home meds prn klonopin.  Procedures: 3/28, EGD attempted and aborted due to stricture.  3/30 Diagnostic esophagogastroduodenoscopy,  benign cervical esophageal stricture dilation with Savary dilators, gastric biopsies to rule out H. pylori gastritis  Consultations:  GI  Discharge Exam: BP 134/82 mmHg  Pulse 88  Temp(Src) 98.2 F (36.8 C) (Oral)  Resp 22  Ht 5\' 6"  (1.676 m)  Wt 70.4 kg (155 lb 3.3 oz)  BMI 25.06 kg/m2  SpO2 100%  General: Not in acute distress HEENT:  Eyes: PERRL, EOMI, no scleral icterus  ENT: No discharge from the ears and nose, no pharynx injection, no tonsillar enlargement.   Neck: No JVD, no bruit, no mass felt. Cardiac: S1/S2, RRR, No murmurs, No gallops or rubs Pulm: Good air movement bilaterally. Clear to auscultation bilaterally. No rales, wheezing, rhonchi or rubs. Abd: Soft, nondistended, nontender, no rebound pain, no organomegaly, BS present Ext: No edema bilaterally. 2+DP/PT pulse bilaterally Musculoskeletal: No joint deformities, erythema, or stiffness, ROM full Skin: No rashes.  Neuro: Alert and oriented X3, cranial nerves II-XII grossly intact, muscle strength 5/5 in all extremeties, sensation to light touch intact. Brachial reflex 2+ bilaterally. Knee reflex 1+ bilaterally. Negative Babinski's sign. Normal finger to nose test. Psych: Patient is not psychotic, no suicidal or hemocidal ideation.   Discharge Instructions You were cared for by a hospitalist during your hospital stay. If you have any questions about your discharge medications or the care you received while you were in the hospital after you are discharged, you can call the unit and asked to speak with the hospitalist on call if the hospitalist that took care of you is not available. Once you are discharged, your primary care physician will handle any further medical issues. Please note that NO REFILLS for any discharge medications will be authorized once you are discharged, as it is imperative that you return to your primary care physician (or establish a relationship with a primary care physician if  you do not have one) for your aftercare needs so that they can reassess your need for medications and monitor your lab values.     Medication List    STOP taking these medications        aspirin 81 MG tablet     ibuprofen 200 MG tablet  Commonly known as:  ADVIL,MOTRIN      TAKE these medications        cetirizine 10 MG tablet  Commonly known as:  ZYRTEC  Take 10 mg by mouth daily.     clonazePAM 0.5 MG tablet  Commonly known as:  KLONOPIN  Take 1 tablet (0.5 mg total) by mouth at bedtime as needed (sleep).     fluticasone 50 MCG/ACT nasal spray  Commonly known as:  FLONASE  Place 2 sprays into both nostrils daily.     lisinopril-hydrochlorothiazide 10-12.5 MG per tablet  Commonly known as:  PRINZIDE,ZESTORETIC  Take 1 tablet by mouth daily.     multivitamin with minerals Tabs tablet  Take 1 tablet by mouth daily.     pantoprazole 40 MG tablet  Commonly known as:  PROTONIX  Take 1 tablet (40 mg total) by mouth daily.     VITAMIN D-3 PO  Take 1 tablet by mouth daily.  No Known Allergies     Follow-up Information    Follow up with GUEST, Veneda Melter, MD In 2 weeks.   Specialty:  Internal Medicine   Why:  post hospitalization follow up, repeat cbc in 2wks,   Contact information:   [place] [address] [phone]       Follow up with Garlan Fair, MD In 3 weeks.   Specialty:  Gastroenterology   Why:  f/u on HPylori test result   Contact information:   [address] [place] [address] [phone]        The results of significant diagnostics from this hospitalization (including imaging, microbiology, ancillary and laboratory) are listed below for reference.    Significant Diagnostic Studies: Ct Head Wo Contrast  08/17/2014   CLINICAL DATA:  Acute onset of syncope. Incontinence. Diaphoresis and dizziness. Initial encounter.  EXAM: CT HEAD WITHOUT CONTRAST  TECHNIQUE: Contiguous axial images were obtained  from the base of the skull through the vertex without intravenous contrast.  COMPARISON:  None.  FINDINGS: There is no evidence of acute infarction, mass lesion, or intra- or extra-axial hemorrhage on CT.  Mild periventricular white matter change likely reflects small vessel ischemic microangiopathy.  The posterior fossa, including the cerebellum, brainstem and fourth ventricle, is within normal limits. The third and lateral ventricles, and basal ganglia are unremarkable in appearance. The cerebral hemispheres are symmetric in appearance, with normal gray-white differentiation. No mass effect or midline shift is seen.  There is no evidence of fracture; visualized osseous structures are unremarkable in appearance. The visualized portions of the orbits are within normal limits. The paranasal sinuses and mastoid air cells are well-aerated. No significant soft tissue abnormalities are seen.  IMPRESSION: 1. No acute intracranial pathology seen on CT. 2. Mild small vessel ischemic microangiopathy noted.   Electronically Signed   By: Garald Balding M.D.   On: 08/17/2014 06:05   Dg Esophagus  08/18/2014   CLINICAL DATA:  Unable to passed scope through upper cervical esophagus. History of prior esophageal strictures and dilatation.  EXAM: ESOPHOGRAM/BARIUM SWALLOW  TECHNIQUE: Combined double contrast and single contrast examination performed using effervescent crystals, thick barium liquid, and thin barium liquid.  FLUOROSCOPY TIME:  Radiation Exposure Index (as provided by the fluoroscopic device):  If the device does not provide the exposure index:  Fluoroscopy Time:  1 minutes 30 seconds  Number of Acquired Images:  COMPARISON:  None. There are tight short segment smooth tandem proximal esophageal stenoses noted near the in the mid to lower cervical region. No additional esophageal stricture noted. Normal esophageal motility. No evidence of fold thickening or esophageal mass. No reflux with the water siphon maneuver.   Due to the tight strictures in the upper cervical esophagus, a barium tablet was not administered.  FINDINGS: Two tandem tight smooth short-segment cervical esophageal stenoses, likely due to esophageal webs.   Electronically Signed   By: Rolm Baptise M.D.   On: 08/18/2014 13:10   Dg Esophagus Dilatation  08/19/2014   CLINICAL DATA:    ESOPHAGEAL DILATATION  Fluoroscopy was provided for use by the requesting physician.  No images  were obtained for radiographic interpretation.     Microbiology: No results found for this or any previous visit (from the past 240 hour(s)).   Labs: Basic Metabolic Panel:  Recent Labs Lab 08/16/14 2300 08/17/14 0750 08/19/14 0355  NA 139 140 140  K 4.1 4.1 3.8  CL 105 112 109  CO2 26  23 26  GLUCOSE 161* 80 88  BUN 35* 24* 5*  CREATININE 1.07 0.86 0.89  CALCIUM 8.5 8.4 8.7   Liver Function Tests:  Recent Labs Lab 08/17/14 0750  AST 15  ALT 13  ALKPHOS 37*  BILITOT 0.6  PROT 5.3*  ALBUMIN 2.8*   No results for input(s): LIPASE, AMYLASE in the last 168 hours. No results for input(s): AMMONIA in the last 168 hours. CBC:  Recent Labs Lab 08/18/14 0140 08/18/14 0739 08/18/14 1410 08/18/14 1845 08/19/14 0355  WBC 6.2 8.6 8.9 7.1 6.2  HGB 8.9* 9.6* 9.8* 9.5* 9.3*  HCT 27.3* 29.2* 29.2* 28.7* 28.2*  MCV 93.5 95.1 93.9 93.2 93.7  PLT 206 216 228 230 230   Cardiac Enzymes: No results for input(s): CKTOTAL, CKMB, CKMBINDEX, TROPONINI in the last 168 hours. BNP: BNP (last 3 results) No results for input(s): BNP in the last 8760 hours.  ProBNP (last 3 results) No results for input(s): PROBNP in the last 8760 hours.  CBG:  Recent Labs Lab 08/16/14 2343 08/17/14 0940 08/17/14 1018 08/18/14 0546 08/19/14 0605  GLUCAP 89 54* 135* 71 82       Signed:  Bailee Thall MD, PhD  [facility] 08/19/2014, 2:28 PM

## 2014-08-20 ENCOUNTER — Encounter (HOSPITAL_COMMUNITY): Payer: Self-pay | Admitting: Gastroenterology

## 2014-10-08 ENCOUNTER — Other Ambulatory Visit: Payer: Self-pay | Admitting: Family Medicine

## 2015-01-05 ENCOUNTER — Other Ambulatory Visit: Payer: Self-pay | Admitting: Family Medicine

## 2015-02-06 ENCOUNTER — Other Ambulatory Visit: Payer: Self-pay | Admitting: Family Medicine

## 2015-03-16 ENCOUNTER — Other Ambulatory Visit: Payer: Self-pay | Admitting: Family Medicine

## 2015-04-07 ENCOUNTER — Telehealth: Payer: Self-pay

## 2015-04-07 MED ORDER — LISINOPRIL-HYDROCHLOROTHIAZIDE 10-12.5 MG PO TABS: 1.0000 | ORAL_TABLET | Freq: Every day | ORAL | Status: DC

## 2015-04-07 NOTE — Telephone Encounter (Addendum)
Pt have made an appt for a CPE in December but is in need of his LISINOPRIL 10-12.5 MG. Please call [phone]     [facility] [address]

## 2015-04-07 NOTE — Telephone Encounter (Signed)
30 day supply sent to pharmacy.  

## 2015-04-26 ENCOUNTER — Ambulatory Visit (INDEPENDENT_AMBULATORY_CARE_PROVIDER_SITE_OTHER): Payer: 59 | Admitting: Urgent Care

## 2015-04-26 ENCOUNTER — Encounter: Payer: Self-pay | Admitting: Urgent Care

## 2015-04-26 VITALS — BP 137/85 | HR 86 | Temp 98.3°F | Resp 16 | Ht 65.0 in | Wt 152.0 lb

## 2015-04-26 DIAGNOSIS — I10 Essential (primary) hypertension: Secondary | ICD-10-CM

## 2015-04-26 DIAGNOSIS — J302 Other seasonal allergic rhinitis: Secondary | ICD-10-CM

## 2015-04-26 DIAGNOSIS — Z23 Encounter for immunization: Secondary | ICD-10-CM | POA: Diagnosis not present

## 2015-04-26 DIAGNOSIS — Z0000 Encounter for general adult medical examination without abnormal findings: Secondary | ICD-10-CM

## 2015-04-26 LAB — CBC
HEMATOCRIT: 41.9 % (ref 39.0–52.0)
HEMOGLOBIN: 13.7 g/dL (ref 13.0–17.0)
MCH: 29.1 pg (ref 26.0–34.0)
MCHC: 32.7 g/dL (ref 30.0–36.0)
MCV: 89 fL (ref 78.0–100.0)
MPV: 9.6 fL (ref 8.6–12.4)
Platelets: 335 10*3/uL (ref 150–400)
RBC: 4.71 MIL/uL (ref 4.22–5.81)
RDW: 14.2 % (ref 11.5–15.5)
WBC: 5.1 10*3/uL (ref 4.0–10.5)

## 2015-04-26 LAB — COMPREHENSIVE METABOLIC PANEL
ALBUMIN: 4.2 g/dL (ref 3.6–5.1)
ALT: 10 U/L (ref 9–46)
AST: 14 U/L (ref 10–35)
Alkaline Phosphatase: 59 U/L (ref 40–115)
BUN: 10 mg/dL (ref 7–25)
CALCIUM: 9.1 mg/dL (ref 8.6–10.3)
CHLORIDE: 99 mmol/L (ref 98–110)
CO2: 28 mmol/L (ref 20–31)
Creat: 1.01 mg/dL (ref 0.70–1.33)
Glucose, Bld: 69 mg/dL (ref 65–99)
POTASSIUM: 4.4 mmol/L (ref 3.5–5.3)
Sodium: 137 mmol/L (ref 135–146)
TOTAL PROTEIN: 7.1 g/dL (ref 6.1–8.1)
Total Bilirubin: 0.7 mg/dL (ref 0.2–1.2)

## 2015-04-26 LAB — LIPID PANEL
CHOLESTEROL: 214 mg/dL — AB (ref 125–200)
HDL: 64 mg/dL (ref >=40)
LDL Cholesterol: 135 mg/dL — ABNORMAL HIGH (ref <130)
TRIGLYCERIDES: 76 mg/dL (ref <150)
Total CHOL/HDL Ratio: 3.3 Ratio (ref <=5.0)
VLDL: 15 mg/dL (ref <30)

## 2015-04-26 LAB — TSH: TSH: 0.885 u[IU]/mL (ref 0.350–4.500)

## 2015-04-26 MED ORDER — LISINOPRIL-HYDROCHLOROTHIAZIDE 10-12.5 MG PO TABS: 1.0000 | ORAL_TABLET | Freq: Every day | ORAL | Status: DC

## 2015-04-26 MED ORDER — FLUTICASONE PROPIONATE 50 MCG/ACT NA SUSP: 2.0000 | Freq: Every day | NASAL | Status: DC

## 2015-04-26 NOTE — Patient Instructions (Signed)

## 2015-04-26 NOTE — Progress Notes (Signed)
MRN: [id number]  Subjective:   [name] is a 59 y.o. male presenting for annual physical exam.  Medical care team includes: PCP: GUEST, Veneda Melter, MD Visual: [facility], yearly exams Dental: Cleanings every 6 months  Patient is currently married, has 2 children (68 and 24 y/o boys). He works in [employer], does not smoke cigarettes, has 1-2 drinks of alcohol per week. He does not exercise but eats healthily.  Kenneth Maynard has HTN (hypertension); Syncope; GIB (gastrointestinal bleeding); Upper GI bleed; Acute blood loss anemia; and Essential hypertension on his problem list.  GI - no follow up scheduled currently, last colonoscopy performed 2016, with normal findings. He was hospitalized for esophageal stricture 07/2014 and duodenal ulcer. Reports that this has since resolved, he is to return to GI on as needed basis.  HTN - takes lis-HCT. ROS as below.  Allergies - takes Zyrtec but does not think it is working anymore. Used to take Flonase but stopped because he tasted it in the back of his throat.   Kenneth Maynard has a current medication list which includes the following prescription(s): cetirizine, lisinopril-hydrochlorothiazide, and multivitamin with minerals. He has No Known Allergies.  Kenneth Maynard  has a past medical history of Hypertension; Allergy; Esophageal stricture; and GIB (gastrointestinal bleeding). Also  has past surgical history that includes Esophagogastroduodenoscopy (10/19/2011); Savory dilation (10/19/2011); Colonoscopy with propofol (N/A, 02/19/2014); Esophagogastroduodenoscopy (egd) with propofol (N/A, 02/24/2014); Savory dilation (N/A, 02/24/2014); Esophagogastroduodenoscopy (N/A, 08/17/2014); Esophagogastroduodenoscopy (N/A, 08/19/2014); and Savory dilation (N/A, 08/19/2014).  family history includes Cancer in his mother; Colon cancer in his father; Hyperlipidemia in his father; Hypertension in his father and sister; Stroke in his other.  Immunizations: [facility]  [facility], last TDAP unknown.  Review of Systems  Constitutional: Negative for fever, chills, weight loss, malaise/fatigue and diaphoresis.  HENT: Negative for congestion, ear discharge, ear pain, hearing loss, nosebleeds, sore throat and tinnitus.   Eyes: Negative for blurred vision, double vision, photophobia, pain, discharge and redness.  Respiratory: Negative for cough, shortness of breath and wheezing.   Cardiovascular: Negative for chest pain, palpitations and leg swelling.  Gastrointestinal: Negative for nausea, vomiting, abdominal pain, diarrhea, constipation and blood in stool.  Genitourinary: Negative for dysuria, urgency, frequency, hematuria and flank pain.  Musculoskeletal: Negative for myalgias, back pain and joint pain.  Skin: Negative for itching and rash.  Neurological: Negative for dizziness, tingling, seizures, loss of consciousness, weakness and headaches.  Endo/Heme/Allergies: Positive for environmental allergies. Negative for polydipsia.  Psychiatric/Behavioral: Negative for depression, suicidal ideas, hallucinations, memory loss and substance abuse. The patient is not nervous/anxious and does not have insomnia.    Objective:   Vitals: BP 137/85 mmHg  Pulse 86  Temp(Src) 98.3 F (36.8 C) (Oral)  Resp 16  Ht 5\' 5"  (1.651 m)  Wt 152 lb (68.947 kg)  BMI 25.29 kg/m2  SpO2 98%  Physical Exam  Constitutional: He is oriented to person, place, and time. He appears well-developed and well-nourished.  HENT:  TM's intact bilaterally, no effusions or erythema. Nares patent, nasal turbinates mildly boggy and edematous. Nasal passages patent. No sinus tenderness. Oropharynx clear, mucous membranes moist, dentition in good repair.  Eyes: Conjunctivae and EOM are normal. Pupils are equal, round, and reactive to light. Right eye exhibits no discharge. Left eye exhibits no discharge. No scleral icterus.  Neck: Normal range of motion. Neck supple. No thyromegaly  present.  Cardiovascular: Normal rate, regular rhythm and intact distal pulses.  Exam reveals no gallop and  no friction rub.   No murmur heard. Pulmonary/Chest: No stridor. No respiratory distress. He has no wheezes. He has no rales.  Abdominal: Soft. Bowel sounds are normal. He exhibits no distension and no mass. There is no tenderness.  Musculoskeletal: Normal range of motion. He exhibits no edema or tenderness.  Lymphadenopathy:    He has no cervical adenopathy.  Neurological: He is alert and oriented to person, place, and time. He has normal reflexes.  Skin: Skin is warm and dry. No rash noted. No erythema. No pallor.  Psychiatric: He has a normal mood and affect.   Assessment and Plan :   1. Annual physical exam - Discussed healthy lifestyle, diet, exercise, preventative care, vaccinations, and addressed patient's concerns.   2. Essential hypertension - Well controlled, continue lis-HCT. Follow up in 6 months.  3. Seasonal allergies - Counseled patient on appropriate administration of Flonase, consider adding levocetirizine or starting Allegra or Claritin if no improvement.  4. Need for Tdap vaccination - Tdap vaccine greater than or equal to 7yo IM   Jaynee Eagles, PA-C [facility] [phone] 04/26/2015  2:02 PM

## 2015-05-12 ENCOUNTER — Other Ambulatory Visit: Payer: Self-pay | Admitting: Family Medicine

## 2015-08-04 ENCOUNTER — Other Ambulatory Visit: Payer: Self-pay

## 2015-08-04 DIAGNOSIS — I10 Essential (primary) hypertension: Secondary | ICD-10-CM

## 2015-08-04 MED ORDER — LISINOPRIL-HYDROCHLOROTHIAZIDE 10-12.5 MG PO TABS: 1.0000 | ORAL_TABLET | Freq: Every day | ORAL | Status: DC

## 2015-10-28 ENCOUNTER — Ambulatory Visit: Payer: 59 | Admitting: Urgent Care

## 2016-05-02 ENCOUNTER — Other Ambulatory Visit: Payer: Self-pay | Admitting: Urgent Care

## 2016-05-02 DIAGNOSIS — I10 Essential (primary) hypertension: Secondary | ICD-10-CM

## 2016-05-09 ENCOUNTER — Ambulatory Visit (INDEPENDENT_AMBULATORY_CARE_PROVIDER_SITE_OTHER): Payer: 59 | Admitting: Physician Assistant

## 2016-05-09 VITALS — BP 122/72 | HR 91 | Temp 98.2°F | Resp 17 | Ht 64.5 in | Wt 156.0 lb

## 2016-05-09 DIAGNOSIS — I10 Essential (primary) hypertension: Secondary | ICD-10-CM | POA: Diagnosis not present

## 2016-05-09 DIAGNOSIS — Z23 Encounter for immunization: Secondary | ICD-10-CM

## 2016-05-09 DIAGNOSIS — Z131 Encounter for screening for diabetes mellitus: Secondary | ICD-10-CM

## 2016-05-09 DIAGNOSIS — Z130 Encounter for screening for diseases of the blood and blood-forming organs and certain disorders involving the immune mechanism: Secondary | ICD-10-CM | POA: Diagnosis not present

## 2016-05-09 DIAGNOSIS — Z13220 Encounter for screening for lipoid disorders: Secondary | ICD-10-CM

## 2016-05-09 DIAGNOSIS — Z0000 Encounter for general adult medical examination without abnormal findings: Secondary | ICD-10-CM | POA: Diagnosis not present

## 2016-05-09 MED ORDER — LISINOPRIL-HYDROCHLOROTHIAZIDE 10-12.5 MG PO TABS: 1.0000 | ORAL_TABLET | Freq: Every day | ORAL | 3 refills | Status: DC

## 2016-05-09 MED ORDER — ZOSTER VACCINE LIVE 19400 UNT/0.65ML ~~LOC~~ SUSR: 0.6500 mL | Freq: Once | SUBCUTANEOUS | 0 refills | Status: AC

## 2016-05-09 NOTE — Patient Instructions (Addendum)
Please take your prescription for your Shingles vaccination to your pharmacy. It was nice meeting you today. Have a happy holiday!   Thank you for coming in today. I hope you feel we met your needs.  Feel free to call [facility] if you have any questions or further requests.  Please consider signing up for MyChart if you do not already have it, as this is a great way to communicate with me.  Best,  Whitney McVey, PA-C    IF you received an x-ray today, you will receive an invoice from [facility]. Please contact [facility] at [phone] with questions or concerns regarding your invoice.   IF you received labwork today, you will receive an invoice from [facility]. Please contact [facility] at [phone] with questions or concerns regarding your invoice.   Our billing staff will not be able to assist you with questions regarding bills from these companies.  You will be contacted with the lab results as soon as they are available. The fastest way to get your results is to activate your My Chart account. Instructions are located on the last page of this paperwork. If you have not heard from [place] regarding the results in 2 weeks, please contact this office.

## 2016-05-09 NOTE — Progress Notes (Signed)
[facility] [address] [place] [address] [phone]  Date:  05/09/2016   Name:  Kenneth Maynard   DOB:  Oct 20, 1955   MRN:  [id number]  PCP:  Kennon Portela, MD    Chief Complaint: Annual Exam and Immunizations (flu )   History of Present Illness:  This is a 60 y.o. male with PMH HTN, GI bleed who is presenting for CPE. 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   HTN - Well controlled with Prinzide 10-12.5. Has been on this dose for about 5 years now.  History of GI bleed - 07/2014 esophageal stricture, duodenal ulcer. No problems since. Feeling well. GI f/u as needed.   Complaints:  None.  Immunizations: Needs Zostavaccination. Will give prescription today Dentist: He is due for an exam. Usually goes every six months.  Eye: Wears glasses.  Diet: Eats healthy Exercise: Walks Fam hx: Father colon cancer, HTN, HLD; Mother cancer; Sister HLD, HTN Urinary hesitancy/frequency or nocturia:   Problems with erectile dysfunction: Tobacco/alcohol/substance use: None. Social drinker.   Colonoscopy: 2016, negative.    Review of Systems:  Review of Systems  Constitutional: Negative for activity change, appetite change and fatigue.  HENT: Negative for congestion, dental problem, sneezing and tinnitus.   Eyes: Negative for visual disturbance.  Respiratory: Negative for cough, chest tightness, shortness of breath and wheezing.   Cardiovascular: Negative for chest pain, palpitations and leg swelling.  Gastrointestinal: Negative for abdominal pain, blood in stool, constipation, diarrhea, nausea and vomiting.  Endocrine: Negative for polydipsia, polyphagia and polyuria.  Genitourinary: Negative for decreased urine volume, difficulty urinating, discharge, hematuria, scrotal swelling and testicular pain.  Musculoskeletal: Negative for arthralgias, back pain and  neck stiffness.  Allergic/Immunologic: Negative for environmental allergies and food allergies.  Neurological: Negative for dizziness, syncope, weakness, light-headedness and headaches.  Psychiatric/Behavioral: Negative for sleep disturbance. The patient is not nervous/anxious.     Patient Active Problem List   Diagnosis Date Noted  . Syncope 08/17/2014  . GIB (gastrointestinal bleeding) 08/17/2014  . Upper GI bleed   . Acute blood loss anemia   . Essential hypertension   . HTN (hypertension) 12/11/2011    Prior to Admission medications   Medication Sig Start Date End Date Taking? Authorizing Provider  cetirizine (ZYRTEC) 10 MG tablet Take 10 mg by mouth daily.    Yes Historical Provider, MD  fluticasone (FLONASE) 50 MCG/ACT nasal spray Place 2 sprays into both nostrils daily. 04/26/15  Yes Jaynee Eagles, PA-C  lisinopril-hydrochlorothiazide (PRINZIDE,ZESTORETIC) 10-12.5 MG tablet Take 1 tablet by mouth daily. 08/04/15  Yes Jaynee Eagles, PA-C  Multiple Vitamin (MULTIVITAMIN WITH MINERALS) TABS tablet Take 1 tablet by mouth daily.   Yes Historical Provider, MD    No Known Allergies  Past Surgical History:  Procedure Laterality Date  . COLONOSCOPY WITH PROPOFOL N/A 02/19/2014   Procedure: COLONOSCOPY WITH PROPOFOL;  Surgeon: Garlan Fair, MD;  Location: [facility] ENDOSCOPY;  Service: Endoscopy;  Laterality: N/A;  . ESOPHAGOGASTRODUODENOSCOPY  10/19/2011   Procedure: ESOPHAGOGASTRODUODENOSCOPY (EGD);  Surgeon: Garlan Fair, MD;  Location: [facility];  Service: Endoscopy;  Laterality: N/A;  . ESOPHAGOGASTRODUODENOSCOPY N/A 08/17/2014   Procedure: ESOPHAGOGASTRODUODENOSCOPY (EGD);  Surgeon: Teena Irani, MD;  Location: [facility] ENDOSCOPY;  Service: Endoscopy;  Laterality: N/A;  . ESOPHAGOGASTRODUODENOSCOPY N/A 08/19/2014   Procedure: ESOPHAGOGASTRODUODENOSCOPY (EGD);  Surgeon: Garlan Fair, MD;  [facility] ENDOSCOPY;  Service: Endoscopy;  Laterality: N/A;  . ESOPHAGOGASTRODUODENOSCOPY (EGD) WITH  PROPOFOL N/A 02/24/2014   Procedure: ESOPHAGOGASTRODUODENOSCOPY (EGD) WITH PROPOFOL;  Surgeon: Jeryl Columbia, MD;  Location: [facility] ENDOSCOPY;  Service: Endoscopy;  Laterality: N/A;  . SAVORY DILATION  10/19/2011   Procedure: SAVORY DILATION;  Surgeon: Garlan Fair, MD;  Location: [facility] ENDOSCOPY;  Service: Endoscopy;  Laterality: N/A;  need carm  . SAVORY DILATION N/A 02/24/2014   Procedure: SAVORY DILATION;  Surgeon: Jeryl Columbia, MD;  Location: [facility] ENDOSCOPY;  Service: Endoscopy;  Laterality: N/A;  . SAVORY DILATION N/A 08/19/2014   Procedure: SAVORY DILATION;  Surgeon: Garlan Fair, MD;  Location: [facility];  Service: Endoscopy;  Laterality: N/A;    Social History  Substance Use Topics  . Smoking status: Never Smoker  . Smokeless tobacco: Never Used  . Alcohol use 0.6 oz/week    1 Glasses of wine per week     Comment: 1-2 drinks rare use    Family History  Problem Relation Age of Onset  . Colon cancer Father   . Hypertension Father   . Hyperlipidemia Father   . Cancer Mother     ovarian  . Hypertension Sister   . Hyperlipidemia Sister   . Stroke Sister   . Stroke Other     Medication list has been reviewed and updated.  Physical Examination:  Physical Exam  Constitutional: He is oriented to person, place, and time. He appears well-developed and well-nourished. No distress.  HENT:  Head: Normocephalic and atraumatic.  Right Ear: External ear normal.  Left Ear: External ear normal.  Nose: Nose normal.  Mouth/Throat: No oropharyngeal exudate.  Eyes: Conjunctivae and EOM are normal. Pupils are equal, round, and reactive to light.  Neck: Normal range of motion. No thyromegaly present.  Cardiovascular: Normal rate, regular rhythm, normal heart sounds and intact distal pulses.   No murmur heard. Pulmonary/Chest: Effort normal and breath sounds normal. No respiratory distress. He has no wheezes.  Abdominal: Soft. Bowel sounds are normal. He exhibits no distension and no  mass. There is no tenderness.  Musculoskeletal: Normal range of motion. He exhibits no edema.  Lymphadenopathy:    He has no cervical adenopathy.  Neurological: He is alert and oriented to person, place, and time. He has normal reflexes.  Skin: Skin is warm and dry.  Psychiatric: He has a normal mood and affect. His behavior is normal. Judgment and thought content normal.  Vitals reviewed.   BP 122/72 (BP Location: Right Arm, Patient Position: Sitting, Cuff Size: Normal)   Pulse 91   Temp 98.2 F (36.8 C) (Oral)   Resp 17   Ht 5' 4.5" (1.638 m)   Wt 156 lb (70.8 kg)   SpO2 99%   BMI 26.36 kg/m   Assessment and Plan: 1. Need for prophylactic vaccination and inoculation against influenza - 60 year old male presents for annual physical exam. Feeling well. No complaints. UTD colonoscopy - 2016, negative. H/o esophageal stricture, resolved. Asymptomatic. Labs pending, will contact w results.  - Flu Vaccine QUAD 36+ mos IM - CBC  2. Essential hypertension - lisinopril-hydrochlorothiazide (PRINZIDE,ZESTORETIC) 10-12.5 MG tablet; Take 1 tablet by mouth daily.  Dispense: 90 tablet; Refill: 3  3. Screening for deficiency anemia 4. Screening, lipid - Lipid panel  5. Need for vaccination for zoster - Zoster Vaccine Live, PF, (ZOSTAVAX) 60109 UNT/0.65ML injection; Inject 19,400 Units into the skin once.  Dispense: 1 each; Refill: 0  6. Screening for diabetes mellitus - CMP14+EGFR   Whitney  Aliani Caccavale, PA-C  [facility] 05/09/2016 11:45 AM

## 2016-05-10 LAB — CMP14+EGFR
ALT: 18 IU/L (ref 0–44)
AST: 20 IU/L (ref 0–40)
Albumin/Globulin Ratio: 1.5 (ref 1.2–2.2)
Albumin: 4.7 g/dL (ref 3.6–4.8)
Alkaline Phosphatase: 74 IU/L (ref 39–117)
BUN/Creatinine Ratio: 13 (ref 10–24)
BUN: 12 mg/dL (ref 8–27)
Bilirubin Total: 0.4 mg/dL (ref 0.0–1.2)
CO2: 25 mmol/L (ref 18–29)
Calcium: 9.6 mg/dL (ref 8.6–10.2)
Chloride: 99 mmol/L (ref 96–106)
Creatinine, Ser: 0.92 mg/dL (ref 0.76–1.27)
GFR calc Af Amer: 104 mL/min/{1.73_m2} (ref >59)
GFR calc non Af Amer: 90 mL/min/{1.73_m2} (ref >59)
Globulin, Total: 3.2 g/dL (ref 1.5–4.5)
Glucose: 79 mg/dL (ref 65–99)
Potassium: 4.3 mmol/L (ref 3.5–5.2)
Sodium: 142 mmol/L (ref 134–144)
Total Protein: 7.9 g/dL (ref 6.0–8.5)

## 2016-05-10 LAB — LIPID PANEL
Chol/HDL Ratio: 3.6 ratio (ref 0.0–5.0)
Cholesterol, Total: 240 mg/dL — ABNORMAL HIGH (ref 100–199)
HDL: 67 mg/dL (ref >39)
LDL Calculated: 157 mg/dL — ABNORMAL HIGH (ref 0–99)
Triglycerides: 81 mg/dL (ref 0–149)
VLDL Cholesterol Cal: 16 mg/dL (ref 5–40)

## 2016-05-10 LAB — CBC
Hematocrit: 46.1 % (ref 37.5–51.0)
Hemoglobin: 15.5 g/dL (ref 13.0–17.7)
MCH: 31.1 pg (ref 26.6–33.0)
MCHC: 33.6 g/dL (ref 31.5–35.7)
MCV: 92 fL (ref 79–97)
Platelets: 302 10*3/uL (ref 150–379)
RBC: 4.99 x10E6/uL (ref 4.14–5.80)
RDW: 13.4 % (ref 12.3–15.4)
WBC: 5.5 10*3/uL (ref 3.4–10.8)

## 2016-05-26 ENCOUNTER — Encounter: Payer: Self-pay | Admitting: Physician Assistant

## 2016-05-26 NOTE — Progress Notes (Signed)
Cholesterol is not improving. Recommend increase exercise, fish oil and healthy diet. He will RTC a few days before annual exam for lab only fasting lipids, which will be discussed at visit.

## 2016-05-26 NOTE — Addendum Note (Signed)
Addended by: Dorise Hiss on: 05/26/2016 12:24 PM   Modules accepted: Orders

## 2016-09-19 IMAGING — CT CT HEAD W/O CM
2 series · 15 of 30 positions shown, 19 images · non-contrast
Comparison: None.

CLINICAL DATA: Acute onset of syncope. Incontinence. Diaphoresis
and dizziness. Initial encounter.

EXAM:
CT HEAD WITHOUT CONTRAST
TECHNIQUE: Contiguous axial images were obtained from the base of the skull
through the vertex without intravenous contrast.

[Series 201: head w/o, idose (1) · axial · non-contrast · 0.49mm/px · z∈[+115,+245]mm · 13 of 32 slices shown, 17 images]
[im 3/32  brain]
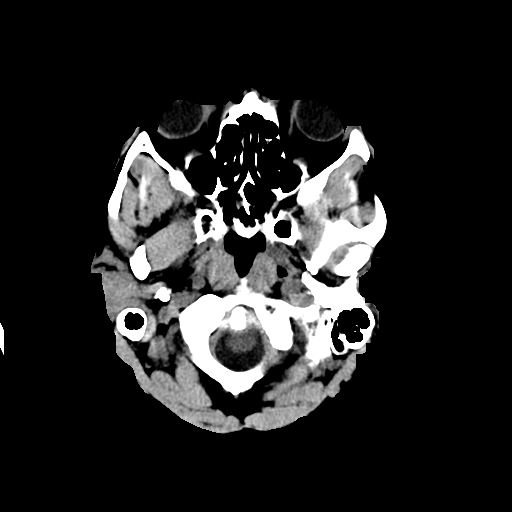
[im 3/32  bone]
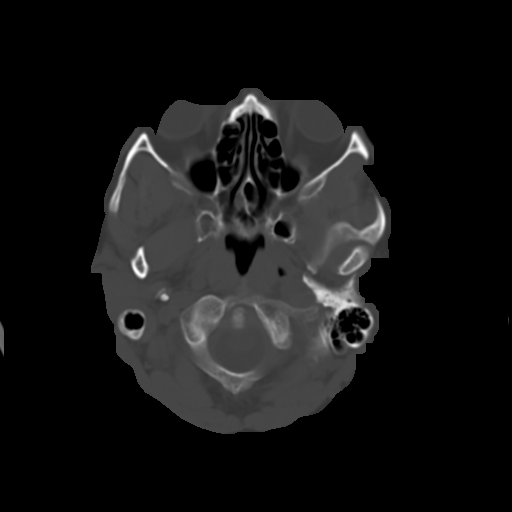
[im 5/32  brain]
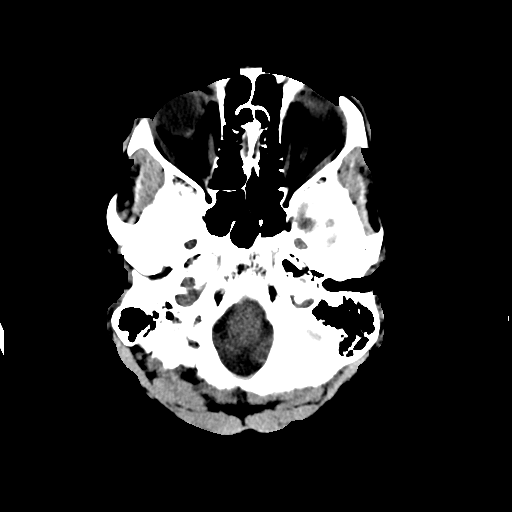
[im 7/32  brain]
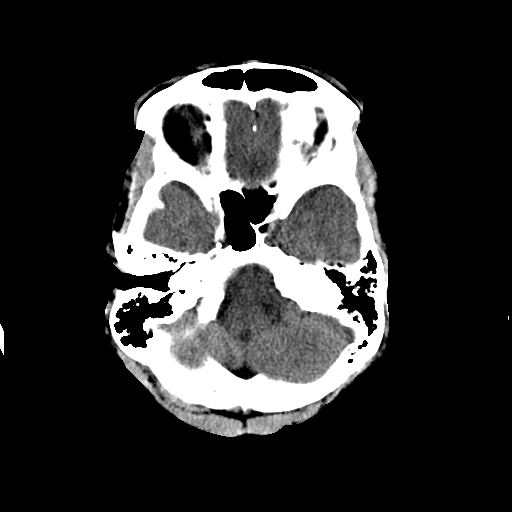
[im 9/32  brain]
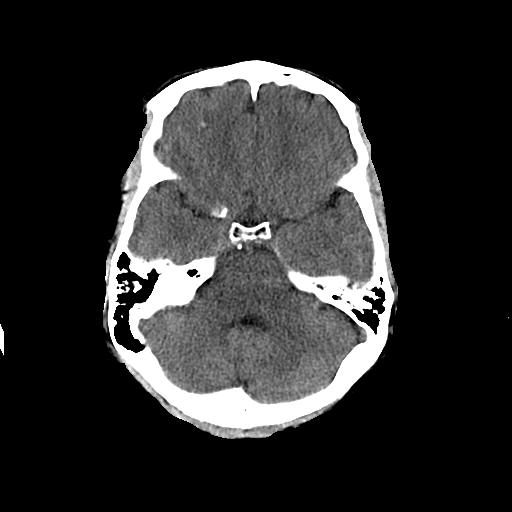
[im 12/32  brain]
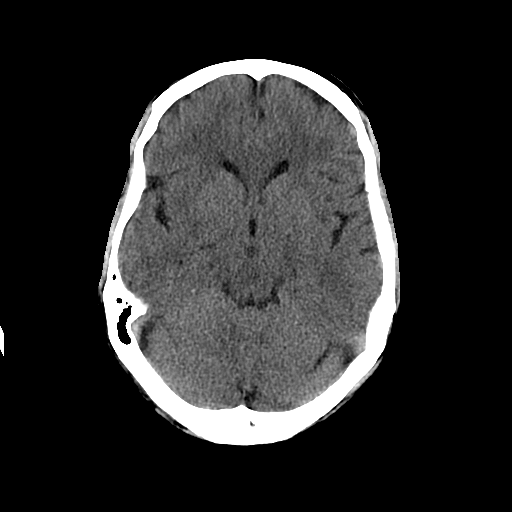
[im 12/32  bone]
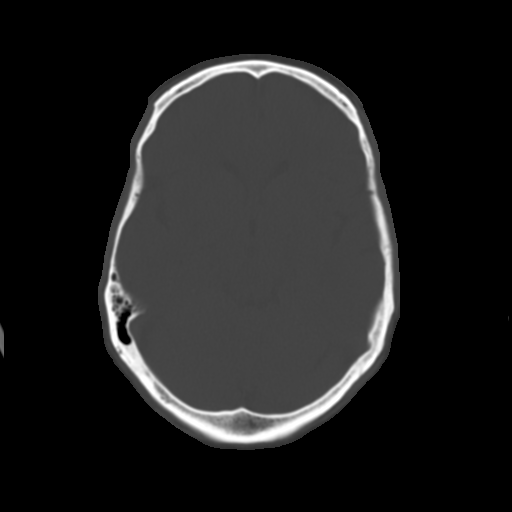
[im 14/32  brain]
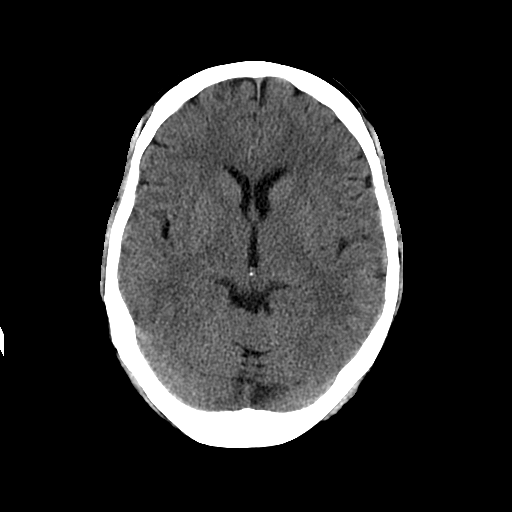
[im 16/32  brain]
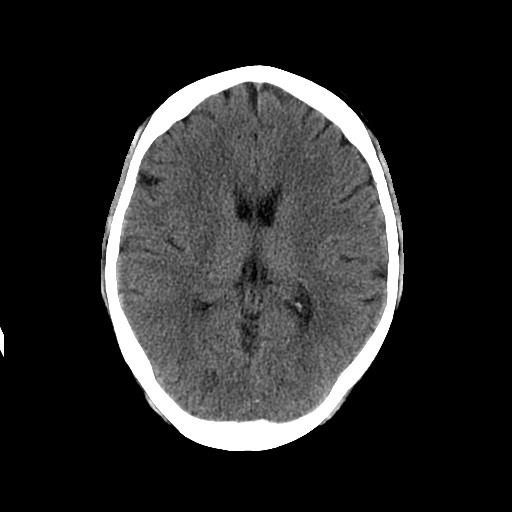
[im 18/32  brain]
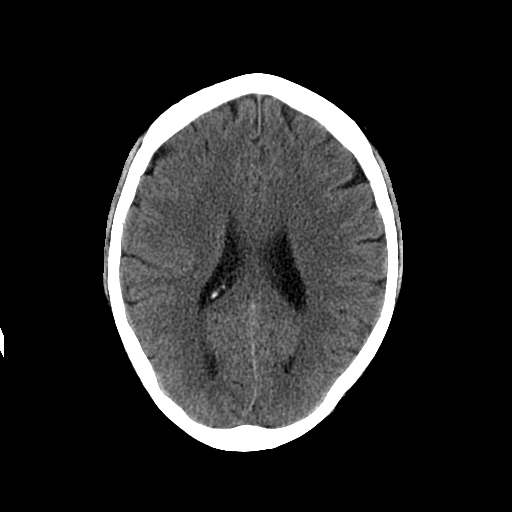
[im 20/32  brain]
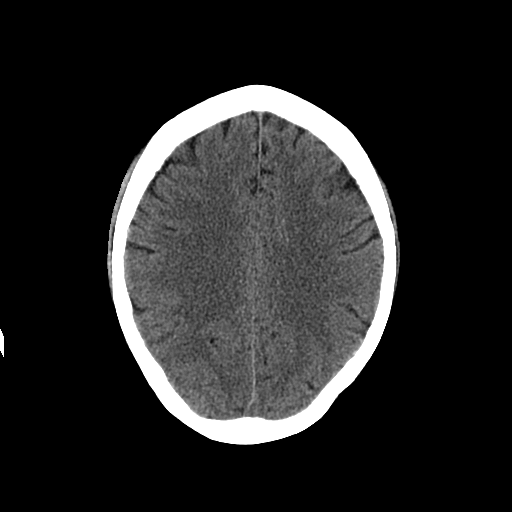
[im 20/32  bone]
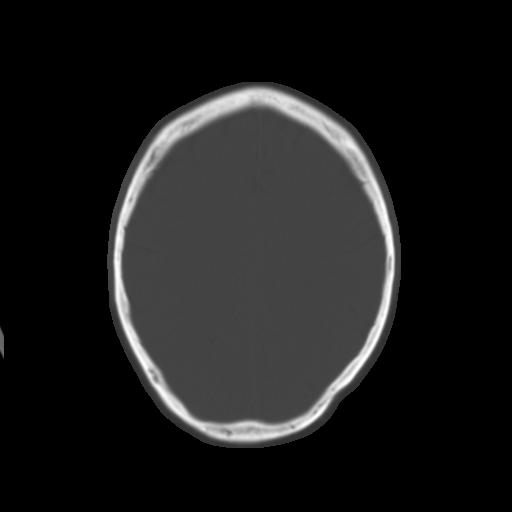
[im 23/32  brain]
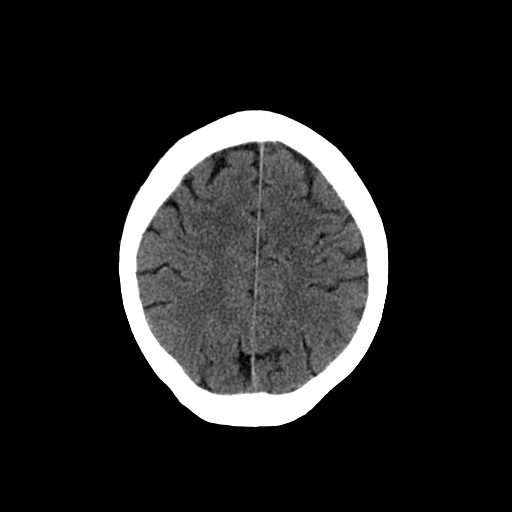
[im 25/32  brain]
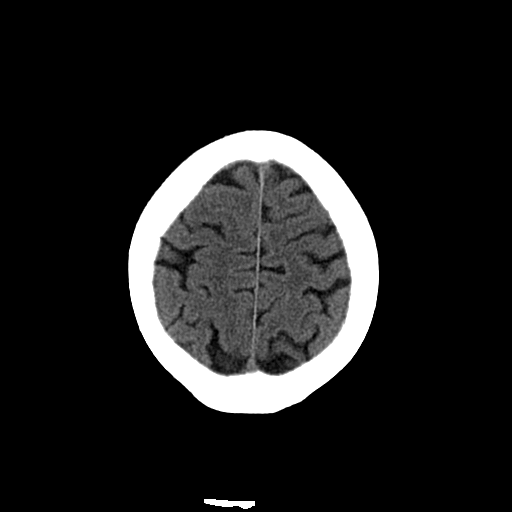
[im 27/32  brain]
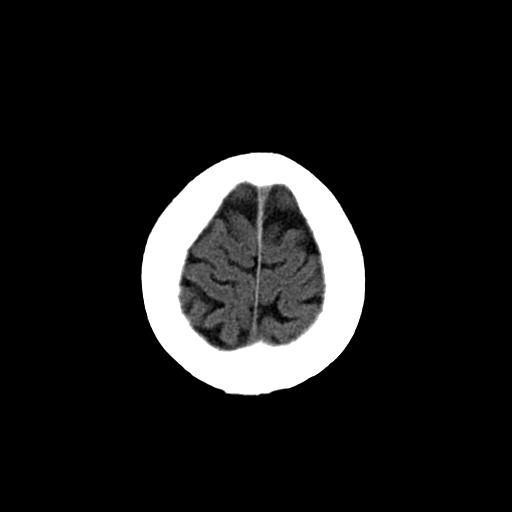
[im 29/32  brain]
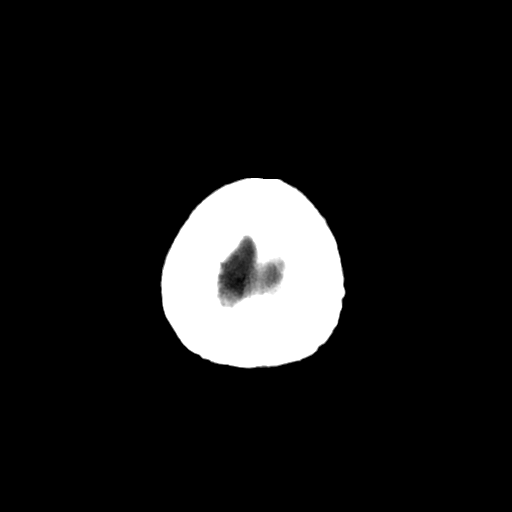
[im 29/32  bone]
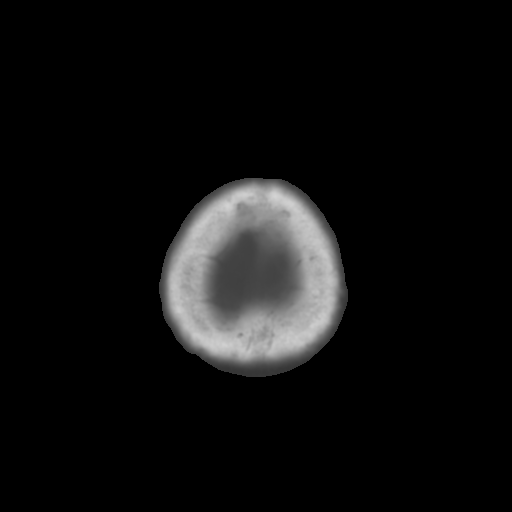

[Series 202: head w/o bone, idose (1) · axial · non-contrast · 0.49mm/px · z∈[+115,+135]mm · 2 of 32 slices shown]
[im 3/32  bone]
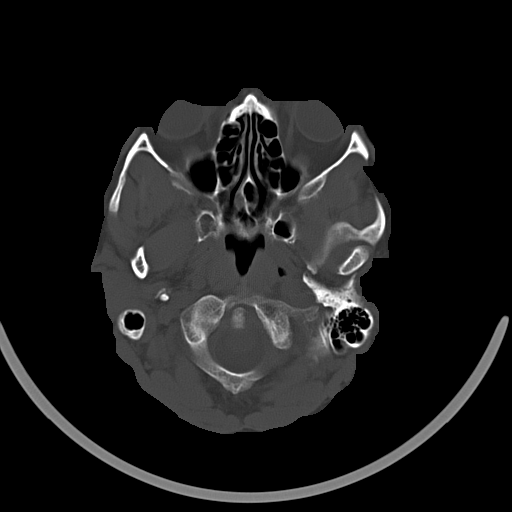
[im 7/32  bone]
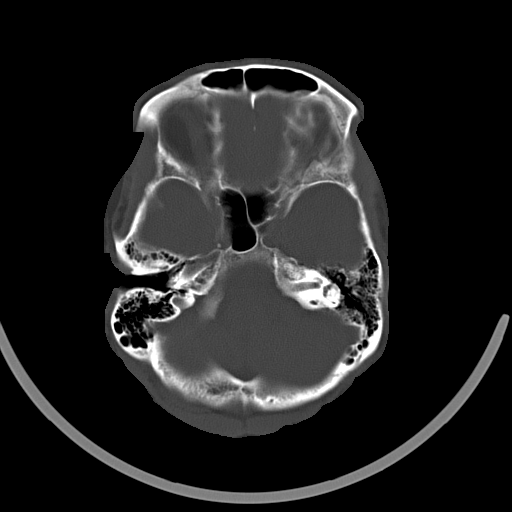

[15 of 30 positions shown; findings below may reference images not displayed]

FINDINGS: There is no evidence of acute infarction, mass lesion, or intra- or
extra-axial hemorrhage on CT.

Mild periventricular white matter change likely reflects small
vessel ischemic microangiopathy.

The posterior fossa, including the cerebellum, brainstem and fourth
ventricle, is within normal limits. The third and lateral
ventricles, and basal ganglia are unremarkable in appearance. The
cerebral hemispheres are symmetric in appearance, with normal
gray-white differentiation. No mass effect or midline shift is seen.

There is no evidence of fracture; visualized osseous structures are
unremarkable in appearance. The visualized portions of the orbits
are within normal limits. The paranasal sinuses and mastoid air
cells are well-aerated. No significant soft tissue abnormalities are
seen.
IMPRESSION: 1. No acute intracranial pathology seen on CT.
2. Mild small vessel ischemic microangiopathy noted.

## 2017-02-14 DIAGNOSIS — R05 Cough: Secondary | ICD-10-CM | POA: Diagnosis not present

## 2017-03-26 ENCOUNTER — Ambulatory Visit: Payer: 59 | Admitting: Physician Assistant

## 2017-03-26 ENCOUNTER — Encounter: Payer: Self-pay | Admitting: Physician Assistant

## 2017-03-26 VITALS — BP 116/84 | HR 83 | Temp 98.1°F | Resp 16 | Ht 64.25 in | Wt 151.2 lb

## 2017-03-26 DIAGNOSIS — J0100 Acute maxillary sinusitis, unspecified: Secondary | ICD-10-CM

## 2017-03-26 MED ORDER — AMOXICILLIN-POT CLAVULANATE 875-125 MG PO TABS: 1.0000 | ORAL_TABLET | Freq: Two times a day (BID) | ORAL | 0 refills | Status: DC

## 2017-03-26 MED ORDER — PREDNISONE 20 MG PO TABS: ORAL_TABLET | ORAL | 0 refills | Status: DC

## 2017-03-26 NOTE — Patient Instructions (Addendum)
Thank you for your patience today.  Stay well hydrated. Get lost of rest. You may also want to try Mucinex DM.   Advil or ibuprofen for pain. Do not take Aspirin.  Throat lozenges (if you are not at risk for choking) or sprays may be used to soothe your throat. Drink enough water and fluids to keep your urine clear or pale yellow. For sore throat: ? Gargle with 8 oz of salt water ( tsp of salt per 1 qt of water) as often as every 1-2 hours to soothe your throat.  Gargle liquid benadryl.  Use Elderberry syrup.   For sore throat try using a honey-based tea. Use 3 teaspoons of honey with juice squeezed from half lemon. Place shaved pieces of ginger into 1/2-1 cup of water and warm over stove top. Then mix the ingredients and repeat every 4 hours as needed.  Cough Syrup Recipe: Sweet Lemon & Honey Thyme  Ingredients a handful of fresh thyme sprigs   1 pint of water (2 cups)  1/2 cup honey (raw is best, but regular will do)  1/2 lemon chopped Instructions 1. Place the lemon in the pint jar and cover with the honey. The honey will macerate the lemons and draw out liquids which taste so delicious! 2. Meanwhile, toss the thyme leaves into a saucepan and cover them with the water. 3. Bring the water to a gentle simmer and reduce it to half, about a cup of tea. 4. When the tea is reduced and cooled a bit, strain the sprigs & leaves, add it into the pint jar and stir it well. 5. Give it a shake and use a spoonful as needed. 6. Store your homemade cough syrup in the refrigerator for about a month.  What causes a cough? In adults, common causes of a cough include: ?An infection of the airways or lungs (such as the common cold) ?Postnasal drip - Postnasal drip is when mucus from the nose drips down or flows along the back of the throat. Postnasal drip can happen when people have: .A cold .Allergies .A sinus infection - The sinuses are hollow areas in the bones of the face that open into the  nose. ?Lung conditions, like asthma and chronic obstructive pulmonary disease (COPD) - Both of these conditions can make it hard to breathe. COPD is usually caused by smoking. ?Acid reflux - Acid reflux is when the acid that is normally in your stomach backs up into your esophagus (the tube that carries food from your mouth to your stomach). ?A side effect from blood pressure medicines called "ACE inhibitors" ?Smoking cigarettes  Is there anything I can do on my own to get rid of my cough? Yes. To help get rid of your cough, you can: ?Use a humidifier in your bedroom ?Use an over-the-counter cough medicine, or suck on cough drops or hard candy ?Stop smoking, if you smoke ?If you have allergies, avoid the things you are allergic to (like pollen, dust, animals, or mold) If you have acid reflux, your doctor or nurse will tell you which lifestyle changes can help reduce symptoms.  SINUSITIS-Patients with acute viral rhinosinusitis (AVRS) should be managed with supportive care. There are no treatments to shorten the clinical course of the disease.  Natural history-AVRS may not completely resolve within 10 days but is expected to improve. Patients who fail to improve after ?10 days of symptomatic management are more likely to have acute bacterial rhinosinusitis. Symptomatic therapies-Symptomatic management of acute rhinosinusitis (ARS)  aims to relieve symptoms of nasal obstruction and runny nose as well as the systemic signs and symptoms such as fever and fatigue. When needed, we suggest over-the-counter (OTC) analgesics and antipyretics, saline irrigation, and intranasal glucocorticoids for symptomatic management in patients with ARS. Analgesics and antipyretics-OTC analgesics and antipyretics such as nonsteroidal anti-inflammatory drugs and acetaminophen can be used for pain and fever relief as needed. Saline irrigation-Mechanical irrigation with saline may reduce the need for pain  medication and improve overall patient comfort, particularly in patients with frequent sinus infections. It is important that irrigants be prepared from sterile or bottled water. (See below for instructions)  Intranasal glucocorticoids- Intranasal glucocorticoids are likely to be most beneficial for patients with underlying allergies. This allows improved sinus drainage. A higher dose of intranasal glucocorticoids had a stronger effect on symptom improvement. -Other- ?Oral decongestants - Oral decongestants may be useful when eustachian tube dysfunction is a factor for patients with AVRS. These patients may benefit from a short course (three to five days) of oral decongestants. Oral decongestants should be used with caution in patients with cardiovascular disease, hypertension, angle-closure glaucoma, or bladder neck obstruction. ?Intranasal decongestants - Intranasal decongestants are often used as symptomatic therapies by patients. These agents, such as oxymetazoline, may provide a subjective sense of improved nasal patency. There is also concern that intranasal decongestants themselves may provoke mucosal inflammation. If used, topical decongestants should be used sparingly for no more than three consecutive days to avoid rebound congestion, addiction, and mucosal damage associated with long-term use. ?Antihistamines - Antihistamines are frequently used for symptom relief due to their drying effects; however, there are no studies investigating their efficacy for ARS. Over-drying of the mucosa may lead to further discomfort. Additionally, antihistamines are often associated with adverse effects.  ?Mucolytics - Mucolytics such as guaifenesin serve to thin secretions and may promote ease of mucus drainage and clearance.   SALINE NASAL IRRIGATION  The benefits  1. Saline (saltwater) washes the mucus and irritants from your nose.  2. The sinus passages are moisturized.  3. Studies have also shown that  a nasal irrigation improves cell function (the cells that move the mucus work better).  The recipe  Use a one-quart glass jar that is thoroughly cleansed.  You may use a large medical syringe (30 cc), water pick with an irrigation tip (preferred method), squeeze bottle, or Neti pot. Do not use a baby bulb syringe. The syringe or pick should be sterilized frequently or replaced every two to three weeks to avoid contamination and infection.  Fill with water that has been distilled, previously boiled, or otherwise sterilized. Plain tap water is not recommended, because it is not necessarily sterile.  Add 1 to 1 heaping teaspoons of pickling/canning salt. Do not use table salt, because it contains a large number of additives.  Add 1 teaspoon of baking soda (pure bicarbonate).  Mix ingredients together, and store at room temperature. Discard after one week.  You may also make up a solution from premixed packets that are commercially prepared specifically for nasal irrigation.  The instructions  Irrigate your nose with saline one to two times per day.   If you have been told to use nasal medication, you should always use your saline solution first. The nasal medication is much more effective when sprayed onto clean nasal membranes, and the spray will reach deeper into the nose.   Pour the amount of fluid you plan to use into a clean bowl. Do not put your used  syringe back into the storage container, because it contaminates your solution.   You may warm the solution slightly in the microwave, but be sure that the solution is not hot.   Bend over the sink (some people do this in the shower), and squirt the solution into each side of your nose, aiming the stream toward the back of your head, not the top of your head. The solution should flow into one nostril and out of the other, but it will not harm you if you swallow a little.   Some people experience a little burning sensation the first few times that  they use buffered saline solution, but this usually goes away after they adapt to it.      IF you received an x-ray today, you will receive an invoice from [facility]. Please contact [facility] at [phone] with questions or concerns regarding your invoice.   IF you received labwork today, you will receive an invoice from [facility]. Please contact [facility] at [phone] with questions or concerns regarding your invoice.   Our billing staff will not be able to assist you with questions regarding bills from these companies.  You will be contacted with the lab results as soon as they are available. The fastest way to get your results is to activate your My Chart account. Instructions are located on the last page of this paperwork. If you have not heard from [place] regarding the results in 2 weeks, please contact this office.

## 2017-03-26 NOTE — Progress Notes (Signed)
Kenneth Maynard  MRN: [id number] DOB: 16-Mar-1956  PCP: Orma Flaming, MD  Subjective:  Pt is a pleasant 61 year old male PMH HTN who presents to clinic for cough x 2 months.   He was treated at an urgent care over one month ago with Promethazine and azithromycin. His cough never went away completely.  He has been using flonase daily and zyrtec - not helping much. Endorses sinus pressure and nasal drainage. His symptoms became worse yesterday.  H/o seasonal allergies.   ROS as below.   Review of Systems  Constitutional: Negative for chills, diaphoresis, fatigue and fever.  HENT: Positive for congestion, postnasal drip, rhinorrhea, sinus pressure and sinus pain. Negative for ear discharge, ear pain, sneezing and sore throat.   Respiratory: Positive for cough. Negative for chest tightness, shortness of breath and wheezing.   Cardiovascular: Negative for chest pain and palpitations.  Gastrointestinal: Negative for abdominal pain, nausea and vomiting.  Musculoskeletal: Negative for arthralgias.  Skin: Negative for rash.  Neurological: Negative for dizziness and headaches.  Psychiatric/Behavioral: Positive for sleep disturbance.    Patient Active Problem List   Diagnosis Date Noted  . Syncope 08/17/2014  . GIB (gastrointestinal bleeding) 08/17/2014  . Upper GI bleed   . Acute blood loss anemia   . Essential hypertension   . HTN (hypertension) 12/11/2011    Current Outpatient Medications on File Prior to Visit  Medication Sig Dispense Refill  . cetirizine (ZYRTEC) 10 MG tablet Take 10 mg by mouth daily.     . fluticasone (FLONASE) 50 MCG/ACT nasal spray Place 2 sprays into both nostrils daily. 16 g 11  . lisinopril-hydrochlorothiazide (PRINZIDE,ZESTORETIC) 10-12.5 MG tablet Take 1 tablet by mouth daily. 90 tablet 3  . Multiple Vitamin (MULTIVITAMIN WITH MINERALS) TABS tablet Take 1 tablet by mouth daily.     No current facility-administered medications on file prior to visit.      No Known Allergies   Objective:  BP 116/84   Pulse 83   Temp 98.1 F (36.7 C) (Oral)   Resp 16   Ht 5' 4.25" (1.632 m)   Wt 151 lb 3.2 oz (68.6 kg)   SpO2 99%   BMI 25.75 kg/m   Physical Exam  Constitutional: He is oriented to person, place, and time and well-developed, well-nourished, and in no distress. No distress.  HENT:  Right Ear: Tympanic membrane normal.  Left Ear: Tympanic membrane normal.  Nose: Right sinus exhibits maxillary sinus tenderness. Right sinus exhibits no frontal sinus tenderness. Left sinus exhibits maxillary sinus tenderness. Left sinus exhibits no frontal sinus tenderness.  Mouth/Throat: Oropharynx is clear and moist and mucous membranes are normal.  Cardiovascular: Normal rate, regular rhythm and normal heart sounds.  Pulmonary/Chest: Breath sounds normal. No respiratory distress. He has no wheezes. He has no rales.  Neurological: He is alert and oriented to person, place, and time. GCS score is 15.  Skin: Skin is warm and dry.  Psychiatric: Mood, memory, affect and judgment normal.  Vitals reviewed.   Assessment and Plan :  1. Subacute maxillary sinusitis - predniSONE (DELTASONE) 20 MG tablet; Take 3 PO QAM x2days, 2 PO QAM x2days, 1 PO QAM x2days  Dispense: 12 tablet; Refill: 0 - amoxicillin-clavulanate (AUGMENTIN) 875-125 MG tablet; Take 1 tablet 2 (two) times daily by mouth.  Dispense: 20 tablet; Refill: 0 - Pt c/o cough x 2 months. Treated with Z-pack about one month ago. No improvement. Will cover with Augmenting. Supportive care encouraged: stay well hydrated and  rest. RTC in 5-7 days if no improvement.   Mercer Pod, PA-C  [facility] 03/26/2017 4:47 PM

## 2017-05-06 ENCOUNTER — Other Ambulatory Visit: Payer: Self-pay | Admitting: Physician Assistant

## 2017-05-06 DIAGNOSIS — I10 Essential (primary) hypertension: Secondary | ICD-10-CM

## 2017-05-18 ENCOUNTER — Other Ambulatory Visit: Payer: Self-pay

## 2017-05-18 ENCOUNTER — Ambulatory Visit (INDEPENDENT_AMBULATORY_CARE_PROVIDER_SITE_OTHER): Payer: 59 | Admitting: Physician Assistant

## 2017-05-18 ENCOUNTER — Encounter: Payer: Self-pay | Admitting: Physician Assistant

## 2017-05-18 VITALS — BP 128/86 | HR 86 | Temp 97.5°F | Resp 18 | Ht 64.57 in | Wt 150.4 lb

## 2017-05-18 DIAGNOSIS — Z130 Encounter for screening for diseases of the blood and blood-forming organs and certain disorders involving the immune mechanism: Secondary | ICD-10-CM

## 2017-05-18 DIAGNOSIS — Z0000 Encounter for general adult medical examination without abnormal findings: Secondary | ICD-10-CM

## 2017-05-18 DIAGNOSIS — Z13220 Encounter for screening for lipoid disorders: Secondary | ICD-10-CM | POA: Diagnosis not present

## 2017-05-18 DIAGNOSIS — Z23 Encounter for immunization: Secondary | ICD-10-CM | POA: Diagnosis not present

## 2017-05-18 DIAGNOSIS — Z1329 Encounter for screening for other suspected endocrine disorder: Secondary | ICD-10-CM

## 2017-05-18 LAB — POCT URINALYSIS DIP (MANUAL ENTRY)
Bilirubin, UA: NEGATIVE
Blood, UA: NEGATIVE
Glucose, UA: NEGATIVE mg/dL
Ketones, POC UA: NEGATIVE mg/dL
Leukocytes, UA: NEGATIVE
Nitrite, UA: NEGATIVE
Protein Ur, POC: NEGATIVE mg/dL
Spec Grav, UA: 1.01 (ref 1.010–1.025)
Urobilinogen, UA: 0.2 U/dL
pH, UA: 6 (ref 5.0–8.0)

## 2017-05-18 NOTE — Patient Instructions (Addendum)
Health Maintenance, Male A healthy lifestyle and preventive care is important for your health and wellness. Ask your health care provider about what schedule of regular examinations is right for you. What should I know about weight and diet? Eat a Healthy Diet  Eat plenty of vegetables, fruits, whole grains, low-fat dairy products, and lean protein.  Do not eat a lot of foods high in solid fats, added sugars, or salt.  Maintain a Healthy Weight Regular exercise can help you achieve or maintain a healthy weight. You should:  Do at least 150 minutes of exercise each week. The exercise should increase your heart rate and make you sweat (moderate-intensity exercise).  Do strength-training exercises at least twice a week.  Watch Your Levels of Cholesterol and Blood Lipids  Have your blood tested for lipids and cholesterol every 5 years starting at 61 years of age. If you are at high risk for heart disease, you should start having your blood tested when you are 61 years old. You may need to have your cholesterol levels checked more often if: ? Your lipid or cholesterol levels are high. ? You are older than 61 years of age. ? You are at high risk for heart disease.  What should I know about cancer screening? Many types of cancers can be detected early and may often be prevented. Lung Cancer  You should be screened every year for lung cancer if: ? You are a current smoker who has smoked for at least 30 years. ? You are a former smoker who has quit within the past 15 years.  Talk to your health care provider about your screening options, when you should start screening, and how often you should be screened.  Colorectal Cancer  Routine colorectal cancer screening usually begins at 61 years of age and should be repeated every 5-10 years until you are 61 years old. You may need to be screened more often if early forms of precancerous polyps or small growths are found. Your health care provider  may recommend screening at an earlier age if you have risk factors for colon cancer.  Your health care provider may recommend using home test kits to check for hidden blood in the stool.  A small camera at the end of a tube can be used to examine your colon (sigmoidoscopy or colonoscopy). This checks for the earliest forms of colorectal cancer.  Prostate and Testicular Cancer  Depending on your age and overall health, your health care provider may do certain tests to screen for prostate and testicular cancer.  Talk to your health care provider about any symptoms or concerns you have about testicular or prostate cancer.  Skin Cancer  Check your skin from head to toe regularly.  Tell your health care provider about any new moles or changes in moles, especially if: ? There is a change in a mole's size, shape, or color. ? You have a mole that is larger than a pencil eraser.  Always use sunscreen. Apply sunscreen liberally and repeat throughout the day.  Protect yourself by wearing long sleeves, pants, a wide-brimmed hat, and sunglasses when outside.  What should I know about heart disease, diabetes, and high blood pressure?  If you are 18-39 years of age, have your blood pressure checked every 3-5 years. If you are 40 years of age or older, have your blood pressure checked every year. You should have your blood pressure measured twice-once when you are at a hospital or clinic, and once when   you are not at a hospital or clinic. Record the average of the two measurements. To check your blood pressure when you are not at a hospital or clinic, you can use: ? An automated blood pressure machine at a pharmacy. ? A home blood pressure monitor.  Talk to your health care provider about your target blood pressure.  If you are between 45-79 years old, ask your health care provider if you should take aspirin to prevent heart disease.  Have regular diabetes screenings by checking your fasting blood  sugar level. ? If you are at a normal weight and have a low risk for diabetes, have this test once every three years after the age of 45. ? If you are overweight and have a high risk for diabetes, consider being tested at a younger age or more often.  A one-time screening for abdominal aortic aneurysm (AAA) by ultrasound is recommended for men aged 65-75 years who are current or former smokers. What should I know about preventing infection? Hepatitis B If you have a higher risk for hepatitis B, you should be screened for this virus. Talk with your health care provider to find out if you are at risk for hepatitis B infection. Hepatitis C Blood testing is recommended for:  Everyone born from 1945 through 1965.  Anyone with known risk factors for hepatitis C.  Sexually Transmitted Diseases (STDs)  You should be screened each year for STDs including gonorrhea and chlamydia if: ? You are sexually active and are younger than 61 years of age. ? You are older than 61 years of age and your health care provider tells you that you are at risk for this type of infection. ? Your sexual activity has changed since you were last screened and you are at an increased risk for chlamydia or gonorrhea. Ask your health care provider if you are at risk.  Talk with your health care provider about whether you are at high risk of being infected with HIV. Your health care provider may recommend a prescription medicine to help prevent HIV infection.  What else can I do?  Schedule regular health, dental, and eye exams.  Stay current with your vaccines (immunizations).  Do not use any tobacco products, such as cigarettes, chewing tobacco, and e-cigarettes. If you need help quitting, ask your health care provider.  Limit alcohol intake to no more than 2 drinks per day. One drink equals 12 ounces of beer, 5 ounces of wine, or 1 ounces of hard liquor.  Do not use street drugs.  Do not share needles.  Ask your  health care provider for help if you need support or information about quitting drugs.  Tell your health care provider if you often feel depressed.  Tell your health care provider if you have ever been abused or do not feel safe at home. This information is not intended to replace advice given to you by your health care provider. Make sure you discuss any questions you have with your health care provider. Document Released: 11/04/2007 Document Revised: 01/05/2016 Document Reviewed: 02/09/2015 Elsevier Interactive Patient Education  2018 Elsevier Inc.     IF you received an x-ray today, you will receive an invoice from [facility]. Please contact [facility] at [phone] with questions or concerns regarding your invoice.   IF you received labwork today, you will receive an invoice from [facility]. Please contact [facility] at [phone] with questions or concerns regarding your invoice.   Our billing staff will not be   able to assist you with questions regarding bills from these companies.  You will be contacted with the lab results as soon as they are available. The fastest way to get your results is to activate your My Chart account. Instructions are located on the last page of this paperwork. If you have not heard from us regarding the results in 2 weeks, please contact this office.       

## 2017-05-18 NOTE — Progress Notes (Signed)
[facility] at [place], [place] [address] [id number] [phone]  Date:  05/18/2017   Name:  Kenneth Maynard   DOB:  04/13/1956   MRN:  [id number]  PCP:  Orma Flaming, MD    Chief Complaint: Annual Exam   History of Present Illness:  This is a 61 y.o. male with PMH HTN, GIB, anemia who is presenting for CPE. Last CPE 04/2016. 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  HTN - Well controlled with Prinzide 10-12.5.  Has been on this dose for about 5 years now. today's blood pressure is 128/86.  History of GI bleed - 07/2014 esophageal stricture, duodenal ulcer. No problems since. Feeling well. GI f/u as needed.   Complaints:  none Immunizations: needs flu shot.  Dentist: regularly. Missed last 6 mo check due to father going to nursing home re: dementia.  Eye: glasses. 20/13 b/l  Diet: healthy diet.  Exercise:regularly. Walks, lifts weights and pull-ups at home. Has [employer] membership, but doesn't go.  Fam hx: Father colon cancer, HTN, HLD; Mother cancer; Sister HLD, HTN Sexual hx: sex with wife only.  Urinary hesitancy/frequency or nocturia: none Problems with erectile dysfunction: none Tobacco/alcohol/substance use: never smoker, 0.6 oz alcohol/week, no drug use.   Colonoscopy: 2016, negative.  Last PSA 04/2014 - wnl.    Review of Systems:  Review of Systems  Constitutional: Negative for activity change, appetite change and fatigue.  HENT: Negative for congestion, dental problem, sneezing and tinnitus.   Eyes: Negative for visual disturbance.  Respiratory: Negative for cough, chest tightness, shortness of breath and wheezing.   Cardiovascular: Negative for chest pain, palpitations and leg swelling.  Gastrointestinal: Negative for abdominal pain, blood in stool, constipation, diarrhea, nausea and vomiting.  Endocrine: Negative for polydipsia, polyphagia and polyuria.   Genitourinary: Negative for decreased urine volume, difficulty urinating, discharge, hematuria, scrotal swelling and testicular pain.  Musculoskeletal: Negative for arthralgias, back pain and neck stiffness.  Allergic/Immunologic: Negative for environmental allergies and food allergies.  Neurological: Negative for dizziness, syncope, weakness, light-headedness and headaches.  Psychiatric/Behavioral: Negative for sleep disturbance. The patient is not nervous/anxious.     Patient Active Problem List   Diagnosis Date Noted  . Syncope 08/17/2014  . GIB (gastrointestinal bleeding) 08/17/2014  . Upper GI bleed   . Acute blood loss anemia   . Essential hypertension   . HTN (hypertension) 12/11/2011    Prior to Admission medications   Medication Sig Start Date End Date Taking? Authorizing Provider  cetirizine (ZYRTEC) 10 MG tablet Take 10 mg by mouth daily.    Yes [provider]  fluticasone (FLONASE) 50 MCG/ACT nasal spray Place 2 sprays into both nostrils daily. 04/26/15  Yes Jaynee Eagles, PA-C  lisinopril-hydrochlorothiazide (PRINZIDE,ZESTORETIC) 10-12.5 MG tablet Take 1 tablet by mouth daily. Office visit needed 05/06/17  Yes Jadelin Eng, Gelene Mink, PA-C  Multiple Vitamin (MULTIVITAMIN WITH MINERALS) TABS tablet Take 1 tablet by mouth daily.   Yes [provider]    No Known Allergies  Past Surgical History:  Procedure Laterality Date  . COLONOSCOPY WITH PROPOFOL N/A 02/19/2014   Procedure: COLONOSCOPY WITH PROPOFOL;  Surgeon: Garlan Fair, MD;  Location: [facility] ENDOSCOPY;  Service: Endoscopy;  Laterality: N/A;  . ESOPHAGOGASTRODUODENOSCOPY  10/19/2011   Procedure: ESOPHAGOGASTRODUODENOSCOPY (EGD);  Surgeon: Garlan Fair, MD;  Location: [facility];  Service: Endoscopy;  Laterality: N/A;  . ESOPHAGOGASTRODUODENOSCOPY N/A 08/17/2014  Procedure: ESOPHAGOGASTRODUODENOSCOPY (EGD);  Surgeon: Teena Irani, MD;  Location[facility] ENDOSCOPY;  Service: Endoscopy;  Laterality:  N/A;  . ESOPHAGOGASTRODUODENOSCOPY N/A 08/19/2014   Procedure: ESOPHAGOGASTRODUODENOSCOPY (EGD);  Surgeon: Garlan Fair, MD;  Location[facility] ENDOSCOPY;  Service: Endoscopy;  Laterality: N/A;  . ESOPHAGOGASTRODUODENOSCOPY (EGD) WITH PROPOFOL N/A 02/24/2014   Procedure: ESOPHAGOGASTRODUODENOSCOPY (EGD) WITH PROPOFOL;  Surgeon: Jeryl Columbia, MD;  Location: [facility] ENDOSCOPY;  Service: Endoscopy;  Laterality: N/A;  . SAVORY DILATION  10/19/2011   Procedure: SAVORY DILATION;  Surgeon: Garlan Fair, MD;  Location: [facility] ENDOSCOPY;  Service: Endoscopy;  Laterality: N/A;  need carm  . SAVORY DILATION N/A 02/24/2014   Procedure: SAVORY DILATION;  Surgeon: Jeryl Columbia, MD;  Location: [facility] ENDOSCOPY;  Service: Endoscopy;  Laterality: N/A;  . SAVORY DILATION N/A 08/19/2014   Procedure: SAVORY DILATION;  Surgeon: Garlan Fair, MD;  Location: [facility];  Service: Endoscopy;  Laterality: N/A;    Social History   Tobacco Use  . Smoking status: Never Smoker  . Smokeless tobacco: Never Used  Substance Use Topics  . Alcohol use: Yes    Alcohol/week: 0.6 oz    Types: 1 Glasses of wine per week    Comment: 1-2 drinks rare use  . Drug use: No    Family History  Problem Relation Age of Onset  . Colon cancer Father   . Hypertension Father   . Hyperlipidemia Father   . Cancer Father 15       colon cancer  . Cancer Mother        ovarian  . Hypertension Sister   . Hyperlipidemia Sister   . Stroke Sister   . Stroke Other     Medication list has been reviewed and updated.  Physical Examination:  Physical Exam  Constitutional: He is oriented to person, place, and time. He appears well-developed and well-nourished. No distress.  HENT:  Head: Normocephalic and atraumatic.  Right Ear: External ear normal.  Left Ear: External ear normal.  Nose: Nose normal.  Mouth/Throat: No oropharyngeal exudate.  Eyes: Conjunctivae and EOM are normal. Pupils are equal, round, and reactive to light.  Neck: Normal  range of motion. No thyromegaly present.  Cardiovascular: Normal rate, regular rhythm, normal heart sounds and intact distal pulses.  No murmur heard. Pulmonary/Chest: Effort normal and breath sounds normal. No respiratory distress. He has no wheezes.  Abdominal: Soft. Bowel sounds are normal. He exhibits no distension and no mass. There is no tenderness.  Musculoskeletal: Normal range of motion. He exhibits no edema.  Lymphadenopathy:    He has no cervical adenopathy.  Neurological: He is alert and oriented to person, place, and time. He has normal reflexes.  Skin: Skin is warm and dry.  Psychiatric: He has a normal mood and affect. His behavior is normal. Judgment and thought content normal.  Vitals reviewed.   BP 128/86   Pulse 86   Temp (!) 97.5 F (36.4 C) (Oral)   Resp 18   Ht 5' 4.57" (1.64 m)   Wt 150 lb 6.4 oz (68.2 kg)   SpO2 99%   BMI 25.36 kg/m   Results for orders placed or performed in visit on 05/18/17  POCT urinalysis dipstick  Result Value Ref Range   Color, UA yellow yellow   Clarity, UA clear clear   Glucose, UA negative negative mg/dL   Bilirubin, UA negative negative   Ketones, POC UA negative negative mg/dL   Spec Grav, UA 1.010 1.010 - 1.025  Blood, UA negative negative   pH, UA 6.0 5.0 - 8.0   Protein Ur, POC negative negative mg/dL   Urobilinogen, UA 0.2 0.2 or 1.0 E.U./dL   Nitrite, UA Negative Negative   Leukocytes, UA Negative Negative    Assessment and Plan: 1. Annual physical exam - Pt presents for annual exam. Feeling well today. Blood pressure controlled. No refills needed. Flu shot given. Colonoscopy: 2016, negative.  Last PSA 04/2014 - wnl. Routine labs are pending. He is fasting today. Advised healthy lifestyle.  RTC in 1 year for routine exam.   2. Screening, lipid - Lipid panel  3. Screening for deficiency anemia - CBC with Differential/Platelet  4. Screening for endocrine disorder - CMP14+EGFR - POCT urinalysis  dipstick  5. Need for prophylactic vaccination and inoculation against influenza - Flu Vaccine QUAD 36+ mos IM (Fluarix & Fluzone Quad PF  Mercer Pod, PA-C  [facility] 05/18/2017 10:51 AM

## 2017-05-19 LAB — CBC WITH DIFFERENTIAL/PLATELET
Basophils Absolute: 0 10*3/uL (ref 0.0–0.2)
Basos: 1 %
EOS (ABSOLUTE): 0 10*3/uL (ref 0.0–0.4)
Eos: 0 %
Hematocrit: 44.3 % (ref 37.5–51.0)
Hemoglobin: 15.2 g/dL (ref 13.0–17.7)
Immature Grans (Abs): 0 10*3/uL (ref 0.0–0.1)
Immature Granulocytes: 0 %
Lymphocytes Absolute: 1.2 10*3/uL (ref 0.7–3.1)
Lymphs: 24 %
MCH: 30.9 pg (ref 26.6–33.0)
MCHC: 34.3 g/dL (ref 31.5–35.7)
MCV: 90 fL (ref 79–97)
Monocytes Absolute: 0.4 10*3/uL (ref 0.1–0.9)
Monocytes: 8 %
Neutrophils Absolute: 3.3 10*3/uL (ref 1.4–7.0)
Neutrophils: 67 %
Platelets: 291 10*3/uL (ref 150–379)
RBC: 4.92 x10E6/uL (ref 4.14–5.80)
RDW: 13.5 % (ref 12.3–15.4)
WBC: 4.8 10*3/uL (ref 3.4–10.8)

## 2017-05-19 LAB — CMP14+EGFR
ALT: 10 IU/L (ref 0–44)
AST: 17 IU/L (ref 0–40)
Albumin/Globulin Ratio: 1.3 (ref 1.2–2.2)
Albumin: 4.3 g/dL (ref 3.6–4.8)
Alkaline Phosphatase: 68 IU/L (ref 39–117)
BUN/Creatinine Ratio: 15 (ref 10–24)
BUN: 15 mg/dL (ref 8–27)
Bilirubin Total: 0.4 mg/dL (ref 0.0–1.2)
CO2: 20 mmol/L (ref 20–29)
Calcium: 9.8 mg/dL (ref 8.6–10.2)
Chloride: 98 mmol/L (ref 96–106)
Creatinine, Ser: 1.03 mg/dL (ref 0.76–1.27)
GFR calc Af Amer: 90 mL/min/{1.73_m2} (ref >59)
GFR calc non Af Amer: 78 mL/min/{1.73_m2} (ref >59)
Globulin, Total: 3.2 g/dL (ref 1.5–4.5)
Glucose: 79 mg/dL (ref 65–99)
Potassium: 4.6 mmol/L (ref 3.5–5.2)
Sodium: 138 mmol/L (ref 134–144)
Total Protein: 7.5 g/dL (ref 6.0–8.5)

## 2017-05-19 LAB — LIPID PANEL
Chol/HDL Ratio: 4 ratio (ref 0.0–5.0)
Cholesterol, Total: 267 mg/dL — ABNORMAL HIGH (ref 100–199)
HDL: 67 mg/dL (ref >39)
LDL Calculated: 183 mg/dL — ABNORMAL HIGH (ref 0–99)
Triglycerides: 87 mg/dL (ref 0–149)
VLDL Cholesterol Cal: 17 mg/dL (ref 5–40)

## 2017-05-25 DIAGNOSIS — R001 Bradycardia, unspecified: Secondary | ICD-10-CM | POA: Diagnosis not present

## 2017-05-25 DIAGNOSIS — R002 Palpitations: Secondary | ICD-10-CM | POA: Diagnosis not present

## 2017-05-28 DIAGNOSIS — J849 Interstitial pulmonary disease, unspecified: Secondary | ICD-10-CM | POA: Diagnosis not present

## 2017-05-28 DIAGNOSIS — M349 Systemic sclerosis, unspecified: Secondary | ICD-10-CM | POA: Diagnosis not present

## 2017-05-29 DIAGNOSIS — E039 Hypothyroidism, unspecified: Secondary | ICD-10-CM | POA: Diagnosis not present

## 2017-05-29 DIAGNOSIS — R7989 Other specified abnormal findings of blood chemistry: Secondary | ICD-10-CM | POA: Diagnosis not present

## 2017-05-29 DIAGNOSIS — J8410 Pulmonary fibrosis, unspecified: Secondary | ICD-10-CM | POA: Diagnosis not present

## 2017-06-01 DIAGNOSIS — R002 Palpitations: Secondary | ICD-10-CM | POA: Diagnosis not present

## 2017-06-01 DIAGNOSIS — M349 Systemic sclerosis, unspecified: Secondary | ICD-10-CM | POA: Diagnosis not present

## 2017-06-01 DIAGNOSIS — E0590 Thyrotoxicosis, unspecified without thyrotoxic crisis or storm: Secondary | ICD-10-CM | POA: Diagnosis not present

## 2017-06-04 ENCOUNTER — Other Ambulatory Visit: Payer: Self-pay | Admitting: Physician Assistant

## 2017-06-04 DIAGNOSIS — E7800 Pure hypercholesterolemia, unspecified: Secondary | ICD-10-CM

## 2017-06-04 NOTE — Progress Notes (Signed)
Please call pt:  Your blood counts, liver and kidney function and salts in your blood look great.  Your cholesterol levels have gone up: Your total cholesterol and LDL (Bad cholesterol) continue to rise. Try to eat healthier, cut down on fatty foods, fried foods, limit red meat to once a week, eat balanced meals including a serving of vegetables and/or fruits with every meal. Continue or increase the amount you are exercising. You can also consider adding a fish oil supplement to your diet.   Come back for a LAB ONLY recheck in 6 months. Please be fasting.  Your results and plan have been released to My Chart.  Thank you!

## 2017-06-26 DIAGNOSIS — J849 Interstitial pulmonary disease, unspecified: Secondary | ICD-10-CM | POA: Diagnosis not present

## 2017-06-26 DIAGNOSIS — M349 Systemic sclerosis, unspecified: Secondary | ICD-10-CM | POA: Diagnosis not present

## 2017-07-16 DIAGNOSIS — J849 Interstitial pulmonary disease, unspecified: Secondary | ICD-10-CM | POA: Diagnosis not present

## 2017-07-16 DIAGNOSIS — J069 Acute upper respiratory infection, unspecified: Secondary | ICD-10-CM | POA: Diagnosis not present

## 2017-07-16 DIAGNOSIS — D899 Disorder involving the immune mechanism, unspecified: Secondary | ICD-10-CM | POA: Diagnosis not present

## 2017-07-18 DIAGNOSIS — I493 Ventricular premature depolarization: Secondary | ICD-10-CM | POA: Diagnosis not present

## 2017-07-18 DIAGNOSIS — R002 Palpitations: Secondary | ICD-10-CM | POA: Diagnosis not present

## 2017-07-19 DIAGNOSIS — E039 Hypothyroidism, unspecified: Secondary | ICD-10-CM | POA: Diagnosis not present

## 2017-07-24 DIAGNOSIS — M349 Systemic sclerosis, unspecified: Secondary | ICD-10-CM | POA: Diagnosis not present

## 2017-07-24 DIAGNOSIS — J849 Interstitial pulmonary disease, unspecified: Secondary | ICD-10-CM | POA: Diagnosis not present

## 2017-08-05 ENCOUNTER — Other Ambulatory Visit: Payer: Self-pay | Admitting: Physician Assistant

## 2017-08-05 DIAGNOSIS — I10 Essential (primary) hypertension: Secondary | ICD-10-CM

## 2017-08-06 DIAGNOSIS — K219 Gastro-esophageal reflux disease without esophagitis: Secondary | ICD-10-CM | POA: Diagnosis not present

## 2017-08-06 DIAGNOSIS — J849 Interstitial pulmonary disease, unspecified: Secondary | ICD-10-CM | POA: Diagnosis not present

## 2017-08-06 DIAGNOSIS — M349 Systemic sclerosis, unspecified: Secondary | ICD-10-CM | POA: Diagnosis not present

## 2017-08-21 DIAGNOSIS — J849 Interstitial pulmonary disease, unspecified: Secondary | ICD-10-CM | POA: Diagnosis not present

## 2017-08-21 DIAGNOSIS — M349 Systemic sclerosis, unspecified: Secondary | ICD-10-CM | POA: Diagnosis not present

## 2017-09-25 DIAGNOSIS — R8290 Unspecified abnormal findings in urine: Secondary | ICD-10-CM | POA: Diagnosis not present

## 2017-10-22 DIAGNOSIS — M349 Systemic sclerosis, unspecified: Secondary | ICD-10-CM | POA: Diagnosis not present

## 2017-10-22 DIAGNOSIS — I493 Ventricular premature depolarization: Secondary | ICD-10-CM | POA: Diagnosis not present

## 2017-10-22 DIAGNOSIS — R002 Palpitations: Secondary | ICD-10-CM | POA: Diagnosis not present

## 2017-11-01 DIAGNOSIS — I071 Rheumatic tricuspid insufficiency: Secondary | ICD-10-CM | POA: Diagnosis not present

## 2017-11-01 DIAGNOSIS — E039 Hypothyroidism, unspecified: Secondary | ICD-10-CM | POA: Diagnosis not present

## 2017-11-01 DIAGNOSIS — M349 Systemic sclerosis, unspecified: Secondary | ICD-10-CM | POA: Diagnosis not present

## 2017-11-04 ENCOUNTER — Other Ambulatory Visit: Payer: Self-pay | Admitting: Physician Assistant

## 2017-11-04 DIAGNOSIS — I10 Essential (primary) hypertension: Secondary | ICD-10-CM

## 2017-11-15 ENCOUNTER — Other Ambulatory Visit: Payer: Self-pay | Admitting: Physician Assistant

## 2017-11-15 DIAGNOSIS — I10 Essential (primary) hypertension: Secondary | ICD-10-CM

## 2018-02-13 DIAGNOSIS — Z23 Encounter for immunization: Secondary | ICD-10-CM | POA: Diagnosis not present

## 2018-03-11 ENCOUNTER — Other Ambulatory Visit: Payer: Self-pay | Admitting: Physician Assistant

## 2018-03-11 DIAGNOSIS — I10 Essential (primary) hypertension: Secondary | ICD-10-CM

## 2018-03-11 NOTE — Telephone Encounter (Signed)
Copied from [place] [id number]. Topic: Quick Communication - Rx Refill/Question >> Mar 11, 2018  5:25 PM Cecelia Byars, [place] wrote: Medication:  lisinopril-hydrochlorothiazide (PRINZIDE,ZESTORETIC) 10-12.5 MG tablet   ,patient has an appointment on 04/04/18 and will in [place] until Friday   Has the patient contacted their pharmacy? yes  (Agent: If no, request that the patient contact the pharmacy for the refill. (Agent: If yes, when and what did the pharmacy advise?  Preferred Pharmacy (with phone number or street name  [id number] - [place], [place] - [address] [phone] (Phone) [phone] (Fax)    Agent: Please be advised that RX refills may take up to 3 business days. We ask that you follow-up with your pharmacy.

## 2018-03-12 MED ORDER — LISINOPRIL-HYDROCHLOROTHIAZIDE 10-12.5 MG PO TABS: ORAL_TABLET | ORAL | 0 refills | Status: DC

## 2018-03-12 NOTE — Telephone Encounter (Signed)
Copied from [place] [id number]. Topic: Quick Communication - Rx Refill/Question >> Mar 11, 2018  5:25 PM Kenneth Maynard, [place] wrote: Medication:  lisinopril-hydrochlorothiazide (PRINZIDE,ZESTORETIC) 10-12.5 MG tablet   ,patient has an appointment on 04/04/18 and will in [place] until Friday   Has the patient contacted their pharmacy? yes  (Agent: If no, request that the patient contact the pharmacy for the refill. (Agent: If yes, when and what did the pharmacy advise?  Preferred Pharmacy (with phone number or street name  [id number] - [place], [place] - [address] [phone] (Phone) [phone] (Fax)    Agent: Please be advised that RX refills may take up to 3 business days. We ask that you follow-up with your pharmacy.

## 2018-04-04 ENCOUNTER — Ambulatory Visit (INDEPENDENT_AMBULATORY_CARE_PROVIDER_SITE_OTHER): Payer: 59 | Admitting: Physician Assistant

## 2018-04-04 ENCOUNTER — Other Ambulatory Visit: Payer: Self-pay

## 2018-04-04 ENCOUNTER — Encounter: Payer: Self-pay | Admitting: Physician Assistant

## 2018-04-04 VITALS — BP 132/88 | HR 73 | Temp 98.2°F | Resp 16 | Ht 65.75 in | Wt 157.2 lb

## 2018-04-04 DIAGNOSIS — Z0000 Encounter for general adult medical examination without abnormal findings: Secondary | ICD-10-CM

## 2018-04-04 DIAGNOSIS — J302 Other seasonal allergic rhinitis: Secondary | ICD-10-CM

## 2018-04-04 DIAGNOSIS — I10 Essential (primary) hypertension: Secondary | ICD-10-CM

## 2018-04-04 DIAGNOSIS — E785 Hyperlipidemia, unspecified: Secondary | ICD-10-CM | POA: Diagnosis not present

## 2018-04-04 DIAGNOSIS — Z8719 Personal history of other diseases of the digestive system: Secondary | ICD-10-CM

## 2018-04-04 LAB — POCT URINALYSIS DIP (MANUAL ENTRY)
Bilirubin, UA: NEGATIVE
Blood, UA: NEGATIVE
Glucose, UA: NEGATIVE mg/dL
Ketones, POC UA: NEGATIVE mg/dL
Leukocytes, UA: NEGATIVE
Nitrite, UA: NEGATIVE
Protein Ur, POC: NEGATIVE mg/dL
Spec Grav, UA: <=1.005 — AB (ref 1.010–1.025)
Urobilinogen, UA: 0.2 U/dL
pH, UA: 6 (ref 5.0–8.0)

## 2018-04-04 MED ORDER — FLUTICASONE PROPIONATE 50 MCG/ACT NA SUSP: 2.0000 | Freq: Every day | NASAL | 11 refills | Status: DC

## 2018-04-04 MED ORDER — LISINOPRIL-HYDROCHLOROTHIAZIDE 10-12.5 MG PO TABS: ORAL_TABLET | ORAL | 3 refills | Status: DC

## 2018-04-04 NOTE — Progress Notes (Signed)
[facility] [place], [place] [address] [phone]  Date:  04/04/2018   Name:  Karla Vines   DOB:  1956-03-21   MRN:  [id number]  PCP:  Dorise Hiss, PA-C    Chief Complaint: Medication Refill (Flonase nasal spray, Lisinopril-HCTZ 10-12.5 mg) and Annual Exam   History of Present Illness:  This is a 62 y.o. male who  has a past medical history of Allergy, Esophageal stricture, GIB (gastrointestinal bleeding), and Hypertension. is presenting for CPE. 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  HTN - Well controlled. needs refill of Lisinopril-HCTZ 10-12.5 mg. Blood pressure today is 132/88.   Elevated lipids last exam.   Complaints: h/o esophageal stricture. Over the past few months has been feeling more difficult to swallow. He is still able to eat solid foods. No choking. "I feel like I need to get it stretched again"  Medication refill flonase.  Immunizations: utd Dentist: Q6 months.  Eye: glasses. 20/13 b/l Diet: Eats healthy.   Exercise: not regular exercise. Walks and lifts weights at home.  Sexual hx: intercourse with wife only  Urinary hesitancy/frequency or nocturia: none Problems with erectile dysfunction: none Tobacco/alcohol/substance use: never smoker, 0.6 oz alcohol/week, no drug use.   Colonoscopy: 2016, negative.  Last PSA 04/2014 - wnl.   Review of Systems:  Review of Systems  Constitutional: Negative for activity change, appetite change and fatigue.  HENT: Negative for congestion, dental problem, sneezing and tinnitus.   Eyes: Negative for visual disturbance.  Respiratory: Negative for cough, chest tightness, shortness of breath and wheezing.   Cardiovascular: Negative for chest pain, palpitations and leg swelling.  Gastrointestinal: Negative for abdominal pain, blood in stool, constipation, diarrhea, nausea and vomiting.   Endocrine: Negative for polydipsia, polyphagia and polyuria.  Genitourinary: Negative for decreased urine volume, difficulty urinating, discharge, hematuria, scrotal swelling and testicular pain.  Musculoskeletal: Negative for arthralgias, back pain and neck stiffness.  Allergic/Immunologic: Negative for environmental allergies and food allergies.  Neurological: Negative for dizziness, syncope, weakness, light-headedness and headaches.  Psychiatric/Behavioral: Negative for sleep disturbance. The patient is not nervous/anxious.     Patient Active Problem List   Diagnosis Date Noted  . Syncope 08/17/2014  . GIB (gastrointestinal bleeding) 08/17/2014  . Upper GI bleed   . Acute blood loss anemia   . Essential hypertension   . HTN (hypertension) 12/11/2011    Prior to Admission medications   Medication Sig Start Date End Date Taking? Authorizing Provider  cetirizine (ZYRTEC) 10 MG tablet Take 10 mg by mouth daily.    Yes [provider]  fluticasone (FLONASE) 50 MCG/ACT nasal spray Place 2 sprays into both nostrils daily. 04/26/15  Yes Jaynee Eagles, PA-C  lisinopril-hydrochlorothiazide (PRINZIDE,ZESTORETIC) 10-12.5 MG tablet TAKE 1 TABLET DAILY (OFFICE VISIT NEEDED) 03/12/18  Yes Yacine Garriga, Gelene Mink, PA-C  Multiple Vitamin (MULTIVITAMIN WITH MINERALS) TABS tablet Take 1 tablet by mouth daily.   Yes [provider]    No Known Allergies  Past Surgical History:  Procedure Laterality Date  . COLONOSCOPY WITH PROPOFOL N/A 02/19/2014   Procedure: COLONOSCOPY WITH PROPOFOL;  Surgeon: Garlan Fair, MD;  Location: [facility] ENDOSCOPY;  Service: Endoscopy;  Laterality: N/A;  . ESOPHAGOGASTRODUODENOSCOPY  10/19/2011   Procedure: ESOPHAGOGASTRODUODENOSCOPY (EGD);  Surgeon: Garlan Fair, MD;  Location: [facility];  Service: Endoscopy;  Laterality: N/A;  . ESOPHAGOGASTRODUODENOSCOPY N/A 08/17/2014   Procedure: ESOPHAGOGASTRODUODENOSCOPY (EGD);  Surgeon: Teena Irani, MD;   Location: [facility] ENDOSCOPY;  Service: Endoscopy;  Laterality: N/A;  . ESOPHAGOGASTRODUODENOSCOPY N/A 08/19/2014   Procedure: ESOPHAGOGASTRODUODENOSCOPY (EGD);  Surgeon: Garlan Fair, MD;  Location: [facility] ENDOSCOPY;  Service: Endoscopy;  Laterality: N/A;  . ESOPHAGOGASTRODUODENOSCOPY (EGD) WITH PROPOFOL N/A 02/24/2014   Procedure: ESOPHAGOGASTRODUODENOSCOPY (EGD) WITH PROPOFOL;  Surgeon: Jeryl Columbia, MD;  Location: [facility] ENDOSCOPY;  Service: Endoscopy;  Laterality: N/A;  . SAVORY DILATION  10/19/2011   Procedure: SAVORY DILATION;  Surgeon: Garlan Fair, MD;  Location: [facility] ENDOSCOPY;  Service: Endoscopy;  Laterality: N/A;  need carm  . SAVORY DILATION N/A 02/24/2014   Procedure: SAVORY DILATION;  Surgeon: Jeryl Columbia, MD;  Location: [facility] ENDOSCOPY;  Service: Endoscopy;  Laterality: N/A;  . SAVORY DILATION N/A 08/19/2014   Procedure: SAVORY DILATION;  Surgeon: Garlan Fair, MD;  Location: [facility];  Service: Endoscopy;  Laterality: N/A;    Social History   Tobacco Use  . Smoking status: Never Smoker  . Smokeless tobacco: Never Used  Substance Use Topics  . Alcohol use: Yes    Alcohol/week: 1.0 standard drinks    Types: 1 Glasses of wine per week    Comment: 1-2 drinks rare use  . Drug use: No    Family History  Problem Relation Age of Onset  . Colon cancer Father   . Hypertension Father   . Hyperlipidemia Father   . Cancer Father 67       colon cancer  . Cancer Mother        ovarian  . Hypertension Sister   . Hyperlipidemia Sister   . Stroke Sister   . Stroke Other     Medication list has been reviewed and updated.  Physical Examination:  Physical Exam  Constitutional: He is oriented to person, place, and time. No distress.  HENT:  Head: Normocephalic and atraumatic.  Right Ear: External ear normal.  Left Ear: External ear normal.  Nose: Nose normal.  Mouth/Throat: Oropharynx is clear and moist. No oropharyngeal exudate.  Eyes: Pupils are equal, round, and reactive  to light. Conjunctivae and EOM are normal. Right eye exhibits no discharge. No scleral icterus.  Neck: Normal range of motion. Neck supple. No tracheal deviation present. No thyromegaly present.  Cardiovascular: Normal rate, regular rhythm and intact distal pulses.  No murmur heard. Pulmonary/Chest: Effort normal and breath sounds normal. No respiratory distress. He has no wheezes.  Abdominal: Soft. Bowel sounds are normal. He exhibits no distension and no mass. There is no tenderness.  Genitourinary: No discharge found.  Musculoskeletal: Normal range of motion.  Lymphadenopathy:    He has no cervical adenopathy.  Neurological: He is alert and oriented to person, place, and time. He has normal reflexes.  Skin: Skin is warm and dry. He is not diaphoretic.  Psychiatric: He has a normal mood and affect. Judgment normal.    BP 132/88 (BP Location: Right Arm, Patient Position: Sitting, Cuff Size: Normal)   Pulse 73   Temp 98.2 F (36.8 C) (Oral)   Resp 16   Ht 5' 5.75" (1.67 m)   Wt 157 lb 3.2 oz (71.3 kg)   SpO2 99%   BMI 25.57 kg/m   Results for orders placed or performed in visit on 04/04/18  POCT urinalysis dipstick  Result Value Ref Range   Color, UA yellow yellow   Clarity, UA clear clear   Glucose, UA negative negative mg/dL   Bilirubin, UA negative negative   Ketones, POC UA  negative negative mg/dL   Spec Grav, UA <=1.005 (A) 1.010 - 1.025   Blood, UA negative negative   pH, UA 6.0 5.0 - 8.0   Protein Ur, POC negative negative mg/dL   Urobilinogen, UA 0.2 0.2 or 1.0 E.U./dL   Nitrite, UA Negative Negative   Leukocytes, UA Negative Negative    Assessment and Plan: 1. Annual physical exam - pt presents for annual exam. Feeling well today. Colonoscopy: 2016, negative.  Routine labs are pending, will contact with results. Health maintenance and anticipatory guidance discussed. Okay to fill meds.   2. Hyperlipidemia, unspecified hyperlipidemia type - Lipid panel  3.  Essential hypertension - CBC with Differential/Platelet - Comprehensive metabolic panel - POCT urinalysis dipstick - lisinopril-hydrochlorothiazide (PRINZIDE,ZESTORETIC) 10-12.5 MG tablet; TAKE 1 TABLET DAILY (OFFICE VISIT NEEDED)  Dispense: 90 tablet; Refill: 3  4. History of esophageal stricture - Ambulatory referral to Gastroenterology  5. Seasonal allergies - fluticasone (FLONASE) 50 MCG/ACT nasal spray; Place 2 sprays into both nostrils daily.  Dispense: 16 g; Refill: 11  Whitney Mandela Bello, PA-C  [facility] 04/04/2018 2:03 PM

## 2018-04-04 NOTE — Patient Instructions (Addendum)
You will receive a phone call (in 1-2 weeks) to schedule an appointment with GI for evaluation stricture.    For cholesterol:  The American heart Association is an excellent resource for lowering cholesterol. Please check out their website.  Lowering your cholesterol levels will lower your risk of a cardiac event.   Populations with high intakes of omega-3 (n-3) polyunsaturated fatty acids (such as the [place]) have low rates of heart disease I recommend a fish oil/omega-3 supplement daily as well as focusing on low cholesterol diet and exercise. It is important to take an omega-3/fish oil supplement that is highest in the [place] and EPA types of omega-3 so look for those under the active ingredients and pick the one with the most during the next time you purchase supplements. Sometimes the the store brands will actually end up being the best for the least cost. Do not pay attention to the total mg listed on the front of the bottle.    Start taking flaxseed oil or crushed seeds daily or red yeast rice. Red yeast rice is a fermented rice product that has been used in [place] cuisine and medicinally to promote "blood circulation". Red yeast rice: (2.4 g/day)  Daily consumption of almonds (42 g/day or about half a cup or 1.5 ounces), walnuts, pistachios alone or in combination with dark chocolate had favorable effects on total cholesterol  Fiber-rich foods, such as fruits, vegetables, beans, and oats, seem to lower cholesterol and are generally good for your health.    A Mediterranean diet appears to reduce the risk of cardiovascular events. There is no single Mediterranean diet, but such diets are typically high in fruits, vegetables, whole grains, beans, nuts, and seeds and include olive oil as an important source of fat; there are typically low to moderate amounts of fish, poultry, and dairy products, and there is little red meat.   Keeping You Healthy   Get These Tests  Blood Pressure- Have your  blood pressure checked by your healthcare provider at least once a year.  Normal blood pressure is 120/80.  Weight- Have your body mass index (BMI) calculated to screen for obesity.  BMI is a measure of body fat based on height and weight.  You can calculate your own BMI at [employer].it  Cholesterol- Have your cholesterol checked every year.  Diabetes- Have your blood sugar checked every year if you have high blood pressure, high cholesterol, a family history of diabetes or if you are overweight.  Screening for Colon Cancer- Colonoscopy starting at age 88. Screening may begin sooner depending on your family history and other health conditions.  Follow up colonoscopy as directed by your Gastroenterologist.  Screening for Osteoporosis- Screening begins at age 74 with bone density scanning, sooner if you are at higher risk for developing Osteoporosis.   Get these medicines  Calcium with Vitamin D- Your body requires 1200-1500 mg of Calcium a day and [phone] IU of Vitamin D a day.  You can only absorb 500 mg of Calcium at a time therefore Calcium must be taken in 2 or 3 separate doses throughout the day.  Aspirin- Ask your healthcare provider about taking Aspirin to prevent Heart Disease and Stroke.   Get these Immuniztions  Flu shot- Every fall  Pneumonia shot- Once after the age of 65; if you are younger ask your healthcare provider if you need a pneumonia shot.  Tetanus- Every ten years.  Zostavax- Once after the age of 23 to prevent shingles.   Take  these steps  Don't smoke- Your healthcare provider can help you quit. For tips on how to quit, ask your healthcare provider or go to www.smokefree.gov or call 1-800 QUIT-NOW.  Be physically active- Exercise 5 days a week for a minimum of 30 minutes.  If you are not already physically active, start slow and gradually work up to 30 minutes of moderate physical activity.  Try walking, dancing, bike riding, swimming, etc.  Eat  a healthy diet- Eat a variety of healthy foods such as fruits, vegetables, whole grains, low fat milk, low fat cheeses, yogurt, lean meats, chicken, fish, eggs, dried beans, tofu, etc.  For more information go to www.thenutritionsource.org  Dental visit- Brush and floss teeth twice daily; visit your dentist twice a year.  Eye exam- Visit your Optometrist or Ophthalmologist yearly.  Drink alcohol in moderation- Limit alcohol intake to one drink or less a day.  Never drink and drive.  Depression- Your emotional health is as important as your physical health.  If you're feeling down or losing interest in things you normally enjoy, please talk to your healthcare provider.  Seat Belts- can save your life; always wear one  Smoke/Carbon Monoxide detectors- These detectors need to be installed on the appropriate level of your home.  Replace batteries at least once a year.  Violence- If anyone is threatening or hurting you, please tell your healthcare provider.  Living Will/ Health care power of attorney- Discuss with your healthcare provider and family.   If you have lab work done today you will be contacted with your lab results within the next 2 weeks.  If you have not heard from [place] then please contact us. The fastest way to get your results is to register for My Chart.  Thank you for coming in today. I hope you feel we met your needs.  Feel free to call PCP if you have any questions or further requests.  Please consider signing up for MyChart if you do not already have it, as this is a great way to communicate with me.  Best,  Whitney McVey, PA-C  IF you received an x-ray today, you will receive an invoice from [facility]. Please contact [facility] at [phone] with questions or concerns regarding your invoice.   IF you received labwork today, you will receive an invoice from [facility]. Please contact [facility] at [phone] with questions or concerns regarding your  invoice.   Our billing staff will not be able to assist you with questions regarding bills from these companies.  You will be contacted with the lab results as soon as they are available. The fastest way to get your results is to activate your My Chart account. Instructions are located on the last page of this paperwork. If you have not heard from [place] regarding the results in 2 weeks, please contact this office.

## 2018-04-05 LAB — COMPREHENSIVE METABOLIC PANEL WITH GFR
ALT: 14 IU/L (ref 0–44)
Alkaline Phosphatase: 66 IU/L (ref 39–117)
CO2: 22 mmol/L (ref 20–29)
Calcium: 9.8 mg/dL (ref 8.6–10.2)
Creatinine, Ser: 0.86 mg/dL (ref 0.76–1.27)
GFR calc non Af Amer: 93 mL/min/{1.73_m2} (ref >59)
Glucose: 66 mg/dL (ref 65–99)
Potassium: 3.8 mmol/L (ref 3.5–5.2)

## 2018-04-05 LAB — COMPREHENSIVE METABOLIC PANEL
AST: 16 IU/L (ref 0–40)
Albumin/Globulin Ratio: 1.5 (ref 1.2–2.2)
Albumin: 4.7 g/dL (ref 3.6–4.8)
BUN/Creatinine Ratio: 10 (ref 10–24)
BUN: 9 mg/dL (ref 8–27)
Bilirubin Total: 0.5 mg/dL (ref 0.0–1.2)
Chloride: 99 mmol/L (ref 96–106)
GFR calc Af Amer: 107 mL/min/{1.73_m2} (ref >59)
Globulin, Total: 3.1 g/dL (ref 1.5–4.5)
Sodium: 140 mmol/L (ref 134–144)
Total Protein: 7.8 g/dL (ref 6.0–8.5)

## 2018-04-05 LAB — LIPID PANEL
Chol/HDL Ratio: 3.6 ratio (ref 0.0–5.0)
Cholesterol, Total: 253 mg/dL — ABNORMAL HIGH (ref 100–199)
HDL: 70 mg/dL (ref >39)
LDL Calculated: 168 mg/dL — ABNORMAL HIGH (ref 0–99)
Triglycerides: 77 mg/dL (ref 0–149)
VLDL Cholesterol Cal: 15 mg/dL (ref 5–40)

## 2018-04-05 LAB — CBC WITH DIFFERENTIAL/PLATELET
Basophils Absolute: 0 10*3/uL (ref 0.0–0.2)
Basos: 1 %
EOS (ABSOLUTE): 0 10*3/uL (ref 0.0–0.4)
Eos: 0 %
Hematocrit: 46.4 % (ref 37.5–51.0)
Hemoglobin: 15.6 g/dL (ref 13.0–17.7)
Immature Grans (Abs): 0 10*3/uL (ref 0.0–0.1)
Immature Granulocytes: 0 %
Lymphocytes Absolute: 1.3 10*3/uL (ref 0.7–3.1)
Lymphs: 24 %
MCH: 30.7 pg (ref 26.6–33.0)
MCHC: 33.6 g/dL (ref 31.5–35.7)
MCV: 91 fL (ref 79–97)
Monocytes Absolute: 0.3 10*3/uL (ref 0.1–0.9)
Monocytes: 6 %
Neutrophils Absolute: 3.7 10*3/uL (ref 1.4–7.0)
Neutrophils: 69 %
Platelets: 277 10*3/uL (ref 150–450)
RBC: 5.08 x10E6/uL (ref 4.14–5.80)
RDW: 11.9 % — ABNORMAL LOW (ref 12.3–15.4)
WBC: 5.4 10*3/uL (ref 3.4–10.8)

## 2018-04-11 ENCOUNTER — Encounter: Payer: Self-pay | Admitting: Physician Assistant

## 2019-04-06 ENCOUNTER — Other Ambulatory Visit: Payer: Self-pay | Admitting: Physician Assistant

## 2019-04-06 DIAGNOSIS — I10 Essential (primary) hypertension: Secondary | ICD-10-CM

## 2019-06-09 ENCOUNTER — Encounter (INDEPENDENT_AMBULATORY_CARE_PROVIDER_SITE_OTHER): Payer: Self-pay | Admitting: Physician Assistant

## 2019-06-12 ENCOUNTER — Telehealth: Payer: Self-pay | Admitting: Registered Nurse

## 2019-06-12 NOTE — Telephone Encounter (Signed)
[id number] [place], [place] - [address]  lisinopril-hydrochlorothiazide (PRINZIDE,ZESTORETIC) 10-12.5 MG tablet [id number]    Pt scheduled his TOC and med refill appointment he is requesting a temporary refill on the medication listed above to get him to his appointment  Please advise

## 2019-06-13 ENCOUNTER — Other Ambulatory Visit: Payer: Self-pay

## 2019-06-13 DIAGNOSIS — I10 Essential (primary) hypertension: Secondary | ICD-10-CM

## 2019-06-13 MED ORDER — LISINOPRIL-HYDROCHLOROTHIAZIDE 10-12.5 MG PO TABS: ORAL_TABLET | ORAL | 0 refills | Status: DC

## 2019-06-13 NOTE — Telephone Encounter (Signed)
Called pt to let him know that a week supply of his lisinopril-hydrochlorothiazide has been sent to the pharmacy and that no more refills will be provided until he is seen by his provider.

## 2019-06-18 ENCOUNTER — Encounter: Payer: Self-pay | Admitting: Registered Nurse

## 2019-06-18 ENCOUNTER — Other Ambulatory Visit: Payer: Self-pay

## 2019-06-18 ENCOUNTER — Ambulatory Visit (INDEPENDENT_AMBULATORY_CARE_PROVIDER_SITE_OTHER): Payer: Self-pay | Admitting: Registered Nurse

## 2019-06-18 VITALS — BP 142/86 | HR 89 | Temp 98.4°F | Resp 16 | Ht 65.75 in | Wt 157.6 lb

## 2019-06-18 DIAGNOSIS — I10 Essential (primary) hypertension: Secondary | ICD-10-CM

## 2019-06-18 DIAGNOSIS — Z13228 Encounter for screening for other metabolic disorders: Secondary | ICD-10-CM

## 2019-06-18 DIAGNOSIS — Z13220 Encounter for screening for lipoid disorders: Secondary | ICD-10-CM

## 2019-06-18 DIAGNOSIS — Z130 Encounter for screening for diseases of the blood and blood-forming organs and certain disorders involving the immune mechanism: Secondary | ICD-10-CM

## 2019-06-18 DIAGNOSIS — Z1329 Encounter for screening for other suspected endocrine disorder: Secondary | ICD-10-CM

## 2019-06-18 DIAGNOSIS — Z0001 Encounter for general adult medical examination with abnormal findings: Secondary | ICD-10-CM

## 2019-06-18 DIAGNOSIS — J302 Other seasonal allergic rhinitis: Secondary | ICD-10-CM

## 2019-06-18 MED ORDER — FLUTICASONE PROPIONATE 50 MCG/ACT NA SUSP: 2.0000 | Freq: Every day | NASAL | 11 refills | Status: DC

## 2019-06-18 MED ORDER — LISINOPRIL-HYDROCHLOROTHIAZIDE 10-12.5 MG PO TABS: 1.0000 | ORAL_TABLET | Freq: Every day | ORAL | 4 refills | Status: DC

## 2019-06-18 NOTE — Patient Instructions (Signed)
° ° ° °  If you have lab work done today you will be contacted with your lab results within the next 2 weeks.  If you have not heard from us then please contact us. The fastest way to get your results is to register for My Chart. ° ° °IF you received an x-ray today, you will receive an invoice from [facility]. Please contact [facility] at [phone] with questions or concerns regarding your invoice.  ° °IF you received labwork today, you will receive an invoice from [facility]. Please contact [facility] at [phone] with questions or concerns regarding your invoice.  ° °Our billing staff will not be able to assist you with questions regarding bills from these companies. ° °You will be contacted with the lab results as soon as they are available. The fastest way to get your results is to activate your My Chart account. Instructions are located on the last page of this paperwork. If you have not heard from us regarding the results in 2 weeks, please contact this office. °  ° ° ° °

## 2019-06-18 NOTE — Progress Notes (Signed)
Established Patient Office Visit  Subjective:  Patient ID: Kenneth Maynard, male    DOB: March 08, 1956  Age: 64 y.o. MRN: [id number]  CC:  Chief Complaint  Patient presents with  . Transitions Of Care  . Medication Refill    flonase , and lisinopril-hydrochlorothiazide    HPI Kenneth Maynard presents for CPE and labs  Notes history of high esophageal stricture - has had a number of endoscopies in the past to help with this, feels it may be time for another. His past gastroenterologist retired, so he is working to find someone he can trust. He will give [place] a name or request referral in the coming weeks.  Otherwise, history of HTN managed well with lisinopril - HCTZ 10-12.5mg  PO qd. Tolerates this medication well with no complaint. Home BP sits at 120/75-85. No CV concerns  Married x 27 years. 2 children, one at [facility] [employer] other in grad school at [employer]. 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   Past Medical History:  Diagnosis Date  . Allergy   . Esophageal stricture   . GIB (gastrointestinal bleeding)   . Hypertension     Past Surgical History:  Procedure Laterality Date  . COLONOSCOPY WITH PROPOFOL N/A 02/19/2014   Procedure: COLONOSCOPY WITH PROPOFOL;  Surgeon: Garlan Fair, MD;  Location: [facility];  Service: Endoscopy;  Laterality: N/A;  . ESOPHAGOGASTRODUODENOSCOPY  10/19/2011   Procedure: ESOPHAGOGASTRODUODENOSCOPY (EGD);  Surgeon: Garlan Fair, MD;  Location: [facility];  Service: Endoscopy;  Laterality: N/A;  . ESOPHAGOGASTRODUODENOSCOPY N/A 08/17/2014   Procedure: ESOPHAGOGASTRODUODENOSCOPY (EGD);  Surgeon: Teena Irani, MD;  Location: [facility] ENDOSCOPY;  Service: Endoscopy;  Laterality: N/A;  . ESOPHAGOGASTRODUODENOSCOPY N/A 08/19/2014   Procedure: ESOPHAGOGASTRODUODENOSCOPY (EGD);  Surgeon: Garlan Fair, MD;  Location: [facility] ENDOSCOPY;  Service: Endoscopy;  Laterality: N/A;  . ESOPHAGOGASTRODUODENOSCOPY (EGD) WITH PROPOFOL N/A 02/24/2014    Procedure: ESOPHAGOGASTRODUODENOSCOPY (EGD) WITH PROPOFOL;  Surgeon: Jeryl Columbia, MD;  Location: [facility];  Service: Endoscopy;  Laterality: N/A;  . SAVORY DILATION  10/19/2011   Procedure: SAVORY DILATION;  Surgeon: Garlan Fair, MD;  Location: [facility];  Service: Endoscopy;  Laterality: N/A;  need carm  . SAVORY DILATION N/A 02/24/2014   Procedure: SAVORY DILATION;  Surgeon: Jeryl Columbia, MD;  Location: [facility];  Service: Endoscopy;  Laterality: N/A;  . SAVORY DILATION N/A 08/19/2014   Procedure: SAVORY DILATION;  Surgeon: Garlan Fair, MD;  Location: [place];  Service: Endoscopy;  Laterality: N/A;    Family History  Problem Relation Age of Onset  . Colon cancer Father   . Hypertension Father   . Hyperlipidemia Father   . Cancer Father 75       colon cancer  . Cancer Mother        ovarian  . Hypertension Sister   . Hyperlipidemia Sister   . Stroke Sister   . Stroke Other 104    Social History   Socioeconomic History  . Marital status: Married    Spouse name: Not on file  . Number of children: 2  . Years of education: Not on file  . Highest education level: Not on file  Occupational History  . Occupation: Management    Employer: [employer]  Tobacco Use  . Smoking status: Never Smoker  . Smokeless tobacco: Never Used  Substance and Sexual Activity  . Alcohol use: Yes    Alcohol/week: 1.0 standard drinks    Types: 1 Glasses  of wine per week    Comment: 1-2 drinks rare use  . Drug use: No  . Sexual activity: Yes  Other Topics Concern  . Not on file  Social History Narrative   Married. Education: [employer]. Exercise: Yes.   Social Determinants of Health   Financial Resource Strain:   . Difficulty of Paying Living Expenses: Not on file  Food Insecurity:   . Worried About Charity fundraiser in the Last Year: Not on file  . Ran Out of Food in the Last Year: Not on file  Transportation Needs:   . Lack of Transportation  (Medical): Not on file  . Lack of Transportation (Non-Medical): Not on file  Physical Activity:   . Days of Exercise per Week: Not on file  . Minutes of Exercise per Session: Not on file  Stress:   . Feeling of Stress : Not on file  Social Connections:   . Frequency of Communication with Friends and Family: Not on file  . Frequency of Social Gatherings with Friends and Family: Not on file  . Attends Religious Services: Not on file  . Active Member of Clubs or Organizations: Not on file  . Attends Archivist Meetings: Not on file  . Marital Status: Not on file  Intimate Partner Violence:   . Fear of Current or Ex-Partner: Not on file  . Emotionally Abused: Not on file  . Physically Abused: Not on file  . Sexually Abused: Not on file    Outpatient Medications Prior to Visit  Medication Sig Dispense Refill  . cetirizine (ZYRTEC) 10 MG tablet Take 10 mg by mouth daily.     . Multiple Vitamin (MULTIVITAMIN WITH MINERALS) TABS tablet Take 1 tablet by mouth daily.    . fluticasone (FLONASE) 50 MCG/ACT nasal spray Place 2 sprays into both nostrils daily. 16 g 11  . lisinopril-hydrochlorothiazide (ZESTORETIC) 10-12.5 MG tablet TAKE 1 TABLET DAILY (OFFICE VISIT NEEDED) 8 tablet 0   No facility-administered medications prior to visit.    Allergies  Allergen Reactions  . Grass Pollen(K-O-R-T-Swt Vern)     ROS Review of Systems  Constitutional: Negative.   HENT: Negative.   Eyes: Negative.   Respiratory: Negative.   Cardiovascular: Negative.   Gastrointestinal: Negative.   Endocrine: Negative.   Genitourinary: Negative.   Musculoskeletal: Negative.   Allergic/Immunologic: Negative.   Neurological: Negative.   Hematological: Negative.   Psychiatric/Behavioral: Negative.   All other systems reviewed and are negative.     Objective:    Physical Exam  Constitutional: He is oriented to person, place, and time. He appears well-developed and well-nourished. No  distress.  HENT:  Head: Normocephalic and atraumatic.  Right Ear: External ear normal.  Left Ear: External ear normal.  Nose: Nose normal.  Mouth/Throat: Oropharynx is clear and moist. No oropharyngeal exudate.  Eyes: Pupils are equal, round, and reactive to light. Conjunctivae and EOM are normal. Right eye exhibits no discharge. Left eye exhibits no discharge. No scleral icterus.  Neck: No tracheal deviation present. No thyromegaly present.  Cardiovascular: Normal rate, regular rhythm, normal heart sounds and intact distal pulses. Exam reveals no gallop and no friction rub.  No murmur heard. Pulmonary/Chest: Effort normal and breath sounds normal. No respiratory distress. He has no wheezes. He has no rales. He exhibits no tenderness.  Abdominal: Soft. Bowel sounds are normal. He exhibits no distension and no mass. There is no abdominal tenderness. There is no rebound and no guarding.  Musculoskeletal:  General: No tenderness, deformity or edema. Normal range of motion.     Cervical back: Normal range of motion and neck supple.  Lymphadenopathy:    He has no cervical adenopathy.  Neurological: He is alert and oriented to person, place, and time. No cranial nerve deficit. Coordination normal.  Skin: Skin is warm and dry. No rash noted. He is not diaphoretic. No erythema. No pallor.  Psychiatric: He has a normal mood and affect. His behavior is normal. Judgment and thought content normal.  Nursing note and vitals reviewed.   BP (!) 142/86   Pulse 89   Temp 98.4 F (36.9 C) (Temporal)   Resp 16   Ht 5' 5.75" (1.67 m)   Wt 157 lb 9.6 oz (71.5 kg)   SpO2 99%   BMI 25.63 kg/m  Wt Readings from Last 3 Encounters:  06/18/19 157 lb 9.6 oz (71.5 kg)  04/04/18 157 lb 3.2 oz (71.3 kg)  05/18/17 150 lb 6.4 oz (68.2 kg)     There are no preventive care reminders to display for this patient.  There are no preventive care reminders to display for this patient.  Lab Results   Component Value Date   TSH 0.885 04/26/2015   Lab Results  Component Value Date   WBC 5.4 04/04/2018   HGB 15.6 04/04/2018   HCT 46.4 04/04/2018   MCV 91 04/04/2018   PLT 277 04/04/2018   Lab Results  Component Value Date   NA 140 04/04/2018   K 3.8 04/04/2018   CO2 22 04/04/2018   GLUCOSE 66 04/04/2018   BUN 9 04/04/2018   CREATININE 0.86 04/04/2018   BILITOT 0.5 04/04/2018   ALKPHOS 66 04/04/2018   AST 16 04/04/2018   ALT 14 04/04/2018   PROT 7.8 04/04/2018   ALBUMIN 4.7 04/04/2018   CALCIUM 9.8 04/04/2018   ANIONGAP 5 08/19/2014   Lab Results  Component Value Date   CHOL 253 (H) 04/04/2018   Lab Results  Component Value Date   HDL 70 04/04/2018   Lab Results  Component Value Date   LDLCALC 168 (H) 04/04/2018   Lab Results  Component Value Date   TRIG 77 04/04/2018   Lab Results  Component Value Date   CHOLHDL 3.6 04/04/2018   No results found for: HGBA1C    Assessment & Plan:   Problem List Items Addressed This Visit      Cardiovascular and Mediastinum   Essential hypertension   Relevant Medications   lisinopril-hydrochlorothiazide (ZESTORETIC) 10-12.5 MG tablet    Other Visit Diagnoses    Screening for endocrine, metabolic and immunity disorder    -  Primary   Relevant Orders   CBC   Comprehensive metabolic panel   Hemoglobin A1c   Urinalysis   TSH   Lipid screening       Relevant Orders   Lipid Panel   Seasonal allergies       Relevant Medications   fluticasone (FLONASE) 50 MCG/ACT nasal spray      Meds ordered this encounter  Medications  . fluticasone (FLONASE) 50 MCG/ACT nasal spray    Sig: Place 2 sprays into both nostrils daily.    Dispense:  16 g    Refill:  11    Order Specific Question:   Supervising Provider    Answer:   Delia Chimes A [id number]  . lisinopril-hydrochlorothiazide (ZESTORETIC) 10-12.5 MG tablet    Sig: Take 1 tablet by mouth daily. TAKE 1 TABLET DAILY (OFFICE VISIT NEEDED)  Dispense:  90 tablet     Refill:  4    Only 8 tablets until pt see his provider.    Order Specific Question:   Supervising Provider    Answer:   Forrest Moron [id number]    Follow-up: Return in about 1 year (around 06/17/2020) for CPE and labs.   PLAN  No concerning findings on exam  Will refill meds x 1 year, he will continue to monitor his BP at home and if it becomes elevated, he will return to clinic  Labs drawn, will follow up as warranted  Patient encouraged to call clinic with any questions, comments, or concerns.  Maximiano Coss, NP

## 2019-06-19 ENCOUNTER — Encounter: Payer: Self-pay | Admitting: Registered Nurse

## 2019-06-19 LAB — CBC
Hematocrit: 45.5 % (ref 37.5–51.0)
Hemoglobin: 15.3 g/dL (ref 13.0–17.7)
MCH: 31.4 pg (ref 26.6–33.0)
MCHC: 33.6 g/dL (ref 31.5–35.7)
MCV: 93 fL (ref 79–97)
Platelets: 322 10*3/uL (ref 150–450)
RBC: 4.88 x10E6/uL (ref 4.14–5.80)
RDW: 11.8 % (ref 11.6–15.4)
WBC: 4.5 10*3/uL (ref 3.4–10.8)

## 2019-06-19 LAB — URINALYSIS
Bilirubin, UA: NEGATIVE
Glucose, UA: NEGATIVE
Ketones, UA: NEGATIVE
Leukocytes,UA: NEGATIVE
Nitrite, UA: NEGATIVE
Protein,UA: NEGATIVE
RBC, UA: NEGATIVE
Specific Gravity, UA: 1.005 (ref 1.005–1.030)
Urobilinogen, Ur: 0.2 mg/dL (ref 0.2–1.0)
pH, UA: 6.5 (ref 5.0–7.5)

## 2019-06-19 LAB — LIPID PANEL
Chol/HDL Ratio: 3.3 ratio (ref 0.0–5.0)
Cholesterol, Total: 220 mg/dL — ABNORMAL HIGH (ref 100–199)
HDL: 67 mg/dL (ref >39)
LDL Chol Calc (NIH): 141 mg/dL — ABNORMAL HIGH (ref 0–99)
Triglycerides: 66 mg/dL (ref 0–149)
VLDL Cholesterol Cal: 12 mg/dL (ref 5–40)

## 2019-06-19 LAB — HEMOGLOBIN A1C
Est. average glucose Bld gHb Est-mCnc: 105 mg/dL
Hgb A1c MFr Bld: 5.3 % (ref 4.8–5.6)

## 2019-06-19 LAB — COMPREHENSIVE METABOLIC PANEL
ALT: 18 IU/L (ref 0–44)
AST: 18 IU/L (ref 0–40)
Albumin/Globulin Ratio: 1.5 (ref 1.2–2.2)
Albumin: 4.3 g/dL (ref 3.8–4.8)
Alkaline Phosphatase: 74 IU/L (ref 39–117)
BUN/Creatinine Ratio: 9 — ABNORMAL LOW (ref 10–24)
BUN: 9 mg/dL (ref 8–27)
Bilirubin Total: 0.5 mg/dL (ref 0.0–1.2)
CO2: 25 mmol/L (ref 20–29)
Calcium: 9.5 mg/dL (ref 8.6–10.2)
Chloride: 101 mmol/L (ref 96–106)
Creatinine, Ser: 0.96 mg/dL (ref 0.76–1.27)
GFR calc Af Amer: 97 mL/min/{1.73_m2} (ref >59)
GFR calc non Af Amer: 84 mL/min/{1.73_m2} (ref >59)
Globulin, Total: 2.9 g/dL (ref 1.5–4.5)
Glucose: 77 mg/dL (ref 65–99)
Potassium: 4.3 mmol/L (ref 3.5–5.2)
Sodium: 139 mmol/L (ref 134–144)
Total Protein: 7.2 g/dL (ref 6.0–8.5)

## 2019-06-19 LAB — TSH: TSH: 1.09 u[IU]/mL (ref 0.450–4.500)

## 2019-06-20 ENCOUNTER — Encounter: Payer: Self-pay | Admitting: Registered Nurse

## 2019-06-20 DIAGNOSIS — E7800 Pure hypercholesterolemia, unspecified: Secondary | ICD-10-CM

## 2019-06-20 MED ORDER — ATORVASTATIN CALCIUM 20 MG PO TABS: 20.0000 mg | ORAL_TABLET | Freq: Every day | ORAL | 3 refills | Status: DC

## 2019-06-20 NOTE — Progress Notes (Signed)
Cholesterol somehwat elevated Will start on atorvastatin 20mg  PO qd with dinner each evening Will recheck in 1 year Letter sent to patient via [place], NP

## 2019-06-25 ENCOUNTER — Encounter: Payer: Self-pay | Admitting: Registered Nurse

## 2019-06-26 ENCOUNTER — Telehealth: Payer: Self-pay | Admitting: Registered Nurse

## 2019-06-26 NOTE — Telephone Encounter (Signed)
Pt. Is requesting clinical or provider to contact him about the lisinopril-hydrochlorothiazide prescription. Only received 8 tablets when the  prescription says 90 tablets. He said he already came in to see provider like it says on pharmacy note. Number is [phone] Please advise.

## 2019-06-26 NOTE — Telephone Encounter (Signed)
Pt. Is requesting a refill on his lisinopril-hydrochlorothiazide prescription. Only received 8 tablets when the  prescription says 90 tablets. He said he already came in to see provider.  Please advise.

## 2019-06-27 ENCOUNTER — Other Ambulatory Visit: Payer: Self-pay | Admitting: Registered Nurse

## 2019-06-27 DIAGNOSIS — I10 Essential (primary) hypertension: Secondary | ICD-10-CM

## 2019-06-27 MED ORDER — LISINOPRIL-HYDROCHLOROTHIAZIDE 10-12.5 MG PO TABS: 1.0000 | ORAL_TABLET | Freq: Every day | ORAL | 4 refills | Status: DC

## 2019-06-27 NOTE — Telephone Encounter (Signed)
My apologies - while the dispense # and refills were correct, the old "note to pharmacy" had carried over and must not have cleared.  The correct prescription has been sent over.  Thank you  Kathrin Ruddy, NP

## 2019-06-30 ENCOUNTER — Encounter (INDEPENDENT_AMBULATORY_CARE_PROVIDER_SITE_OTHER): Payer: Self-pay

## 2020-04-08 ENCOUNTER — Other Ambulatory Visit: Payer: Self-pay | Admitting: Gastroenterology

## 2020-05-04 ENCOUNTER — Other Ambulatory Visit (HOSPITAL_COMMUNITY)
Admission: RE | Admit: 2020-05-04 | Discharge: 2020-05-04 | Disposition: A | Discharge status: Home / Self Care | Payer: Managed Care, Other (non HMO) | Source: Ambulatory Visit | Attending: Gastroenterology | Admitting: Gastroenterology

## 2020-05-04 DIAGNOSIS — Z20822 Contact with and (suspected) exposure to covid-19: Secondary | ICD-10-CM | POA: Insufficient documentation

## 2020-05-04 DIAGNOSIS — Z01812 Encounter for preprocedural laboratory examination: Secondary | ICD-10-CM | POA: Insufficient documentation

## 2020-05-04 LAB — SARS CORONAVIRUS 2 (TAT 6-24 HRS): SARS Coronavirus 2: NEGATIVE

## 2020-05-05 NOTE — Progress Notes (Signed)
Attempted to obtain medical history via telephone, unable to reach at this time. I left a voicemail to return pre surgical testing department's phone call.  

## 2020-05-06 NOTE — Anesthesia Preprocedure Evaluation (Addendum)
Anesthesia Evaluation  Patient identified by MRN, date of birth, ID band Patient awake    Reviewed: Allergy & Precautions, NPO status , Patient's Chart, lab work & pertinent test results  History of Anesthesia Complications Negative for: history of anesthetic complications  Airway Mallampati: II  TM Distance: >3 FB Neck ROM: Full    Dental no notable dental hx. (+) Dental Advisory Given   Pulmonary neg pulmonary ROS,    Pulmonary exam normal        Cardiovascular hypertension, negative cardio ROS Normal cardiovascular exam     Neuro/Psych negative neurological ROS     GI/Hepatic negative GI ROS, Neg liver ROS,   Endo/Other  negative endocrine ROS  Renal/GU negative Renal ROS     Musculoskeletal negative musculoskeletal ROS (+)   Abdominal   Peds  Hematology negative hematology ROS (+)   Anesthesia Other Findings   Reproductive/Obstetrics                           Anesthesia Physical Anesthesia Plan  ASA: II  Anesthesia Plan: MAC   Post-op Pain Management:    Induction:   PONV Risk Score and Plan: 1 and Ondansetron and Propofol infusion  Airway Management Planned: Natural Airway and Simple Face Mask  Additional Equipment:   Intra-op Plan:   Post-operative Plan:   Informed Consent: I have reviewed the patients History and Physical, chart, labs and discussed the procedure including the risks, benefits and alternatives for the proposed anesthesia with the patient or authorized representative who has indicated his/her understanding and acceptance.     Dental advisory given  Plan Discussed with: Anesthesiologist and CRNA  Anesthesia Plan Comments:        Anesthesia Quick Evaluation

## 2020-05-07 ENCOUNTER — Other Ambulatory Visit: Payer: Self-pay

## 2020-05-07 ENCOUNTER — Ambulatory Visit (HOSPITAL_COMMUNITY): Payer: Managed Care, Other (non HMO)

## 2020-05-07 ENCOUNTER — Ambulatory Visit (HOSPITAL_COMMUNITY): Payer: Managed Care, Other (non HMO) | Admitting: Anesthesiology

## 2020-05-07 ENCOUNTER — Encounter (HOSPITAL_COMMUNITY): Payer: Self-pay | Admitting: Gastroenterology

## 2020-05-07 ENCOUNTER — Ambulatory Visit (HOSPITAL_COMMUNITY)
Admission: RE | Admit: 2020-05-07 | Discharge: 2020-05-07 | Disposition: A | Discharge status: Home / Self Care | Payer: Managed Care, Other (non HMO) | Attending: Gastroenterology | Admitting: Gastroenterology

## 2020-05-07 ENCOUNTER — Encounter (HOSPITAL_COMMUNITY)
Admission: RE | Disposition: A | Discharge status: Home / Self Care | Payer: Self-pay | Source: Home / Self Care | Attending: Gastroenterology

## 2020-05-07 DIAGNOSIS — R1314 Dysphagia, pharyngoesophageal phase: Secondary | ICD-10-CM | POA: Insufficient documentation

## 2020-05-07 DIAGNOSIS — K222 Esophageal obstruction: Secondary | ICD-10-CM | POA: Diagnosis present

## 2020-05-07 DIAGNOSIS — Z8349 Family history of other endocrine, nutritional and metabolic diseases: Secondary | ICD-10-CM | POA: Diagnosis not present

## 2020-05-07 DIAGNOSIS — Z823 Family history of stroke: Secondary | ICD-10-CM | POA: Diagnosis not present

## 2020-05-07 DIAGNOSIS — Z8249 Family history of ischemic heart disease and other diseases of the circulatory system: Secondary | ICD-10-CM | POA: Insufficient documentation

## 2020-05-07 DIAGNOSIS — Z791 Long term (current) use of non-steroidal anti-inflammatories (NSAID): Secondary | ICD-10-CM | POA: Insufficient documentation

## 2020-05-07 DIAGNOSIS — I10 Essential (primary) hypertension: Secondary | ICD-10-CM | POA: Insufficient documentation

## 2020-05-07 DIAGNOSIS — K317 Polyp of stomach and duodenum: Secondary | ICD-10-CM | POA: Insufficient documentation

## 2020-05-07 DIAGNOSIS — Z79899 Other long term (current) drug therapy: Secondary | ICD-10-CM | POA: Diagnosis not present

## 2020-05-07 DIAGNOSIS — K449 Diaphragmatic hernia without obstruction or gangrene: Secondary | ICD-10-CM | POA: Insufficient documentation

## 2020-05-07 DIAGNOSIS — Z800 Family history of malignant neoplasm of digestive organs: Secondary | ICD-10-CM | POA: Insufficient documentation

## 2020-05-07 HISTORY — PX: BIOPSY: SHX5522

## 2020-05-07 HISTORY — PX: ESOPHAGOGASTRODUODENOSCOPY (EGD) WITH PROPOFOL: SHX5813

## 2020-05-07 HISTORY — PX: SAVORY DILATION: SHX5439

## 2020-05-07 SURGERY — ESOPHAGOGASTRODUODENOSCOPY (EGD) WITH PROPOFOL
Anesthesia: Monitor Anesthesia Care

## 2020-05-07 MED ORDER — ALBUTEROL SULFATE HFA 108 (90 BASE) MCG/ACT IN AERS
INHALATION_SPRAY | RESPIRATORY_TRACT | Status: DC | PRN
  Administered 2020-05-07 (×3): 2 via RESPIRATORY_TRACT

## 2020-05-07 MED ORDER — LIDOCAINE 2% (20 MG/ML) 5 ML SYRINGE
INTRAMUSCULAR | Status: DC | PRN
  Administered 2020-05-07: 40 mg via INTRAVENOUS
  Administered 2020-05-07: 60 mg via INTRAVENOUS

## 2020-05-07 MED ORDER — PROPOFOL 10 MG/ML IV BOLUS
INTRAVENOUS | Status: DC | PRN
  Administered 2020-05-07 (×9): 40 mg via INTRAVENOUS

## 2020-05-07 MED ORDER — SODIUM CHLORIDE 0.9 % IV SOLN: INTRAVENOUS | Status: DC

## 2020-05-07 MED ORDER — ONDANSETRON HCL 4 MG/2ML IJ SOLN
INTRAMUSCULAR | Status: DC | PRN
  Administered 2020-05-07: 4 mg via INTRAVENOUS

## 2020-05-07 MED ORDER — LACTATED RINGERS IV SOLN: INTRAVENOUS | Status: DC

## 2020-05-07 SURGICAL SUPPLY — 15 items

## 2020-05-07 NOTE — H&P (Signed)
Primary Care Physician:  Patient, No Pcp Per Primary Gastroenterologist:  Dr. Alessandra Bevels  Reason for Visit : Esophageal dysphagia, proximal esophageal stricture  HPI: Kenneth Maynard is a 64 y.o. male   here for therapeutic EGD for dilation of esophageal stricture.  Patient with history of proximal esophageal stricture. Last EGD by Dr. Wynetta Emery in 2016 showed proximal esophageal stricture. Not able to advance regular endoscope. Pediatric endoscope was used. Dilation with savory up to 12 mm was performed. History of EGD was normal.        Patient started having trouble swallowing again around 2 years ago. Sensation of food getting stuck in the throat. Mostly solid foods.   Underwent EGD with dilation on April 07, 2020 at [facility] endoscopy center.  Was found to have very tight proximal esophageal stricture.  Dilation with balloon up to 8 mm was performed.  I was not able to advance scope even after dilation.         Family history of colon cancer in father in his 31s. Last colonoscopy November 2021 showed diverticulosis and hemorrhoids.  Repeat recommended in 5 years.  Past Medical History:  Diagnosis Date  . Allergy   . Esophageal stricture   . GIB (gastrointestinal bleeding)   . Hypertension     Past Surgical History:  Procedure Laterality Date  . COLONOSCOPY WITH PROPOFOL N/A 02/19/2014   Procedure: COLONOSCOPY WITH PROPOFOL;  Surgeon: Garlan Fair, MD;  Location: [facility]OSCOPY;  Service: Endoscopy;  Laterality: N/A;  . ESOPHAGOGASTRODUODENOSCOPY  10/19/2011   Procedure: ESOPHAGOGASTRODUODENOSCOPY (EGD);  Surgeon: Garlan Fair, MD;  Location: [facility];  Service: Endoscopy;  Laterality: N/A;  . ESOPHAGOGASTRODUODENOSCOPY N/A 08/17/2014   Procedure: ESOPHAGOGASTRODUODENOSCOPY (EGD);  Surgeon: Teena Irani, MD;  Location: [facility] ENDOSCOPY;  Service: Endoscopy;  Laterality: N/A;  . ESOPHAGOGASTRODUODENOSCOPY N/A 08/19/2014   Procedure: ESOPHAGOGASTRODUODENOSCOPY (EGD);  Surgeon: Garlan Fair, MD;  Location: [facility] ENDOSCOPY;  Service: Endoscopy;  Laterality: N/A;  . ESOPHAGOGASTRODUODENOSCOPY (EGD) WITH PROPOFOL N/A 02/24/2014   Procedure: ESOPHAGOGASTRODUODENOSCOPY (EGD) WITH PROPOFOL;  Surgeon: Jeryl Columbia, MD;  Location: [facility];  Service: Endoscopy;  Laterality: N/A;  . SAVORY DILATION  10/19/2011   Procedure: SAVORY DILATION;  Surgeon: Garlan Fair, MD;  Location: [facility];  Service: Endoscopy;  Laterality: N/A;  need carm  . SAVORY DILATION N/A 02/24/2014   Procedure: SAVORY DILATION;  Surgeon: Jeryl Columbia, MD;  Location: [facility];  Service: Endoscopy;  Laterality: N/A;  . SAVORY DILATION N/A 08/19/2014   Procedure: SAVORY DILATION;  Surgeon: Garlan Fair, MD;  Location: [place];  Service: Endoscopy;  Laterality: N/A;    Prior to Admission medications   Medication Sig Start Date End Date Taking? Authorizing Provider  cetirizine (ZYRTEC) 10 MG tablet Take 10 mg by mouth daily as needed for allergies or rhinitis.   Yes [provider]  lisinopril-hydrochlorothiazide (ZESTORETIC) 10-12.5 MG tablet Take 1 tablet by mouth daily. TAKE 1 TABLET DAILY (OFFICE VISIT NEEDED) Patient taking differently: Take 1 tablet by mouth daily. 06/27/19  Yes Maximiano Coss, NP  atorvastatin (LIPITOR) 20 MG tablet Take 1 tablet (20 mg total) by mouth daily. Patient not taking: No sig reported 06/20/19   Maximiano Coss, NP  fluticasone [facility]) 50 MCG/ACT nasal spray Place 2 sprays into both nostrils daily. Patient not taking: No sig reported 06/18/19   Maximiano Coss, NP  ibuprofen (ADVIL) 200 MG tablet Take 400 mg by mouth every 6 (six) hours as needed for mild pain.  [provider]    Scheduled Meds: Continuous Infusions: . sodium chloride     PRN Meds:.  Allergies as of 04/08/2020 - Review Complete 06/18/2019  Allergen Reaction Noted  . Grass pollen(k-o-r-t-swt vern)  06/18/2019    Family History  Problem Relation Age of Onset  .  Colon cancer Father   . Hypertension Father   . Hyperlipidemia Father   . Cancer Father 63       colon cancer  . Cancer Mother        ovarian  . Hypertension Sister   . Hyperlipidemia Sister   . Stroke Sister   . Stroke Other 104    Social History   Socioeconomic History  . Marital status: Married    Spouse name: Not on file  . Number of children: 2  . Years of education: Not on file  . Highest education level: Not on file  Occupational History  . Occupation: Management    Employer: [employer]  Tobacco Use  . Smoking status: Never Smoker  . Smokeless tobacco: Never Used  Vaping Use  . Vaping Use: Never used  Substance and Sexual Activity  . Alcohol use: Yes    Alcohol/week: 1.0 standard drink    Types: 1 Glasses of wine per week    Comment: 1-2 drinks rare use  . Drug use: No  . Sexual activity: Yes  Other Topics Concern  . Not on file  Social History Narrative   Married. Education: [employer]. Exercise: Yes.   Social Determinants of Health   Financial Resource Strain: Not on file  Food Insecurity: Not on file  Transportation Needs: Not on file  Physical Activity: Not on file  Stress: Not on file  Social Connections: Not on file  Intimate Partner Violence: Not on file    Physical Exam: Vital signs: There were no vitals filed for this visit.   General:   Alert,  Well-developed, well-nourished, pleasant and cooperative in NAD Lungs:  Clear throughout to auscultation.   No wheezes, crackles, or rhonchi. No acute distress. Heart:  Regular rate and rhythm; no murmurs, clicks, rubs,  or gallops. Abdomen: Soft, nontender, nondistended, bowel sounds present Rectal:  Deferred  GI:  Lab Results: No results for input(s): WBC, HGB, HCT, PLT in the last 72 hours. BMET No results for input(s): NA, K, CL, CO2, GLUCOSE, BUN, CREATININE, CALCIUM in the last 72 hours. LFT No results for input(s): PROT, ALBUMIN, AST, ALT, ALKPHOS, BILITOT, BILIDIR,  IBILI in the last 72 hours. PT/INR No results for input(s): LABPROT, INR in the last 72 hours.   Studies/Results: No results found.  Impression/Plan: -Proximal esophageal stricture -Esophageal dysphagia  Recommendations --------------------------- -Proceed with EGD with dilation under fluoroscopy.  Risks (bleeding, infection, bowel perforation that could require surgery, sedation-related changes in cardiopulmonary systems), benefits (identification and possible treatment of source of symptoms, exclusion of certain causes of symptoms), and alternatives (watchful waiting, radiographic imaging studies, empiric medical treatment)  were explained to patient in detail and patient wishes to proceed.    LOS: 0 days   Otis Brace  MD, FACP 05/07/2020, 8:44 AM  Contact #  [phone]

## 2020-05-07 NOTE — Anesthesia Postprocedure Evaluation (Signed)
Anesthesia Post Note  Patient: Yigit Norkus  Procedure(s) Performed: ESOPHAGOGASTRODUODENOSCOPY (EGD) WITH PROPOFOL w/DIL Under Fluroscopy (N/A ) SAVORY DILATION under Fluroscopy (N/A ) BIOPSY     Patient location during evaluation: Endoscopy Anesthesia Type: MAC Level of consciousness: awake and alert Pain management: pain level controlled Vital Signs Assessment: post-procedure vital signs reviewed and stable Respiratory status: spontaneous breathing, nonlabored ventilation, respiratory function stable and patient connected to nasal cannula oxygen Cardiovascular status: blood pressure returned to baseline and stable Postop Assessment: no apparent nausea or vomiting Anesthetic complications: no   No complications documented.  Last Vitals:  Vitals:   05/07/20 1030 05/07/20 1040  BP: 133/89 129/89  Pulse: 74 75  Resp: 18 13  Temp:    SpO2: 99% 100%    Last Pain:  Vitals:   05/07/20 1040  TempSrc:   PainSc: 0-No pain                 Shani Fitch DANIEL

## 2020-05-07 NOTE — Transfer of Care (Signed)
Immediate Anesthesia Transfer of Care Note  Patient: Kenneth Maynard  Procedure(s) Performed: ESOPHAGOGASTRODUODENOSCOPY (EGD) WITH PROPOFOL w/DIL Under Fluroscopy (N/A ) SAVORY DILATION under Fluroscopy (N/A ) BIOPSY  Patient Location: PACU and Endoscopy Unit  Anesthesia Type:MAC  Level of Consciousness: awake and alert   Airway & Oxygen Therapy: Patient Spontanous Breathing and Patient connected to face mask oxygen  Post-op Assessment: Report given to RN and Post -op Vital signs reviewed and stable  Post vital signs: Reviewed and stable  Last Vitals:  Vitals Value Taken Time  BP    Temp    Pulse 88 05/07/20 1017  Resp 15 05/07/20 1017  SpO2 97 % 05/07/20 1017  Vitals shown include unvalidated device data.  Last Pain:  Vitals:   05/07/20 0845  TempSrc: Oral  PainSc: 0-No pain         Complications: No complications documented.

## 2020-05-07 NOTE — Op Note (Signed)
[facility] Patient Name: Kenneth Maynard Procedure Date: 05/07/2020 MRN: [id number] Attending MD: Otis Brace , MD Date of Birth: 04/04/1956 CSN: [id number] Age: 64 Admit Type: Outpatient Procedure:                Upper GI endoscopy Indications:              For therapy of esophageal stricture Providers:                Otis Brace, MD, Cleda Daub, RN, Elspeth Cho Tech., Technician, Glenis Smoker, CRNA Referring MD:              Medicines:                Sedation Administered by an Anesthesia Professional Complications:            No immediate complications. Estimated Blood Loss:     Estimated blood loss was minimal. Procedure:                Pre-Anesthesia Assessment:                           - Prior to the procedure, a History and Physical                            was performed, and patient medications and                            allergies were reviewed. The patient's tolerance of                            previous anesthesia was also reviewed. The risks                            and benefits of the procedure and the sedation                            options and risks were discussed with the patient.                            All questions were answered, and informed consent                            was obtained. Prior Anticoagulants: The patient has                            taken no previous anticoagulant or antiplatelet                            agents except for aspirin. ASA Grade Assessment: II                            - A patient with mild systemic disease. After  reviewing the risks and benefits, the patient was                            deemed in satisfactory condition to undergo the                            procedure.                           After obtaining informed consent, the endoscope was                            passed under direct vision. Throughout the                             procedure, the patient's blood pressure, pulse, and                            oxygen saturations were monitored continuously. The                            [id number]-[id number] ([id number]) Olympus ultra slim endoscope                            was introduced through the mouth, and advanced to                            the second part of duodenum. The upper GI endoscopy                            was extremely difficult due to stricture and                            Significant and constant coughing throughout the                            procedure. The patient tolerated the procedure well. Scope In: Scope Out: Findings:      One benign-appearing, intrinsic severe (stenosis; an endoscope cannot       pass) stenosis was found 20 cm from the incisors. This stenosis measured       5 mm (inner diameter). The stenosis was traversed with Moderate       resistance . A guidewire was placed under fluoroscopic guidance and the       scope was withdrawn. Dilation was performed with a Savary dilator with       no resistance at 6 mm and 7 mm. The dilation site was examined following       endoscope reinsertion and showed moderate mucosal disruption, moderate       improvement in luminal narrowing and no perforation. Further dilation       was not attempted because of patient's constant coughing. CRNA       recommended that we do next procedure under general anesthesia because       of patient's constant coughing.      A non-obstructing Schatzki ring was found at the gastroesophageal  junction.      A small hiatal hernia was present.      A single diminutive sessile polyp was found in the cardia. The polyp was       removed with a cold biopsy forceps. Resection and retrieval were       complete.      The exam of the stomach was otherwise normal.      The duodenal bulb, first portion of the duodenum and second portion of       the duodenum were normal. Impression:                - Benign-appearing esophageal stenosis. Dilated.                            CRNA recommended that we do next procedure under                            general anesthesia because of patient's constant                            coughing.                           - Non-obstructing Schatzki ring.                           - Small hiatal hernia.                           - A single gastric polyp. Resected and retrieved.                           - Normal duodenal bulb, first portion of the                            duodenum and second portion of the duodenum. Moderate Sedation:      Moderate (conscious) sedation was personally administered by an       anesthesia professional. The following parameters were monitored: oxygen       saturation, heart rate, blood pressure, and response to care. Recommendation:           - Patient has a contact number available for                            emergencies. The signs and symptoms of potential                            delayed complications were discussed with the                            patient. Return to normal activities tomorrow.                            Written discharge instructions were provided to the                            patient.                           -  Resume previous diet.                           - Continue present medications.                           - Await pathology results.                           - Refer to an ENT specialist at appointment to be                            scheduled. Procedure Code(s):        --- Professional ---                           [phone], Esophagogastroduodenoscopy, flexible,                            transoral; with insertion of guide wire followed by                            passage of dilator(s) through esophagus over guide                            wire                           43239, 78, Esophagogastroduodenoscopy, flexible,                            transoral; with biopsy,  single or multiple                           74360, Intraluminal dilation of strictures and/or                            obstructions (eg, esophagus), radiological                            supervision and interpretation Diagnosis Code(s):        --- Professional ---                           K22.2, Esophageal obstruction                           K44.9, Diaphragmatic hernia without obstruction or                            gangrene                           K31.7, Polyp of stomach and duodenum CPT copyright 2019 American Medical Association. All rights reserved. The codes documented in this report are preliminary and upon coder review may  be revised to meet current compliance requirements. Otis Brace, MD Otis Brace, MD 05/07/2020 10:16:55 AM Number of Addenda: 0

## 2020-05-07 NOTE — Anesthesia Procedure Notes (Signed)
Procedure Name: MAC Date/Time: 05/07/2020 9:30 AM Performed by: Cynda Familia, CRNA Pre-anesthesia Checklist: Patient identified, Emergency Drugs available, Suction available, Patient being monitored and Timeout performed Patient Re-evaluated:Patient Re-evaluated prior to induction Oxygen Delivery Method: Simple face mask Placement Confirmation: positive ETCO2 and breath sounds checked- equal and bilateral Dental Injury: Teeth and Oropharynx as per pre-operative assessment

## 2020-05-07 NOTE — Discharge Instructions (Signed)

## 2020-05-09 ENCOUNTER — Encounter (HOSPITAL_COMMUNITY): Payer: Self-pay | Admitting: Gastroenterology

## 2020-05-10 LAB — SURGICAL PATHOLOGY

## 2020-07-24 ENCOUNTER — Other Ambulatory Visit: Payer: Self-pay | Admitting: Registered Nurse

## 2020-07-24 DIAGNOSIS — I10 Essential (primary) hypertension: Secondary | ICD-10-CM

## 2020-07-24 NOTE — Telephone Encounter (Signed)
Courtesy refill. Pt needs appointment. Requested Prescriptions  Pending Prescriptions Disp Refills  . lisinopril-hydrochlorothiazide (ZESTORETIC) 10-12.5 MG tablet [Pharmacy Med Name: LISINOPRIL-HCTZ 10-12.5 MG TAB] 30 tablet 0    Sig: TAKE 1 TABLET BY MOUTH DAILY. TAKE 1 TABLET DAILY (OFFICE VISIT NEEDED)     Cardiovascular:  ACEI + Diuretic Combos Failed - 07/24/2020 12:47 AM      Failed - Na in normal range and within 180 days    Sodium  Date Value Ref Range Status  06/18/2019 139 134 - 144 mmol/L Final         Failed - K in normal range and within 180 days    Potassium  Date Value Ref Range Status  06/18/2019 4.3 3.5 - 5.2 mmol/L Final         Failed - Cr in normal range and within 180 days    Creat  Date Value Ref Range Status  04/26/2015 1.01 0.70 - 1.33 mg/dL Final   Creatinine, Ser  Date Value Ref Range Status  06/18/2019 0.96 0.76 - 1.27 mg/dL Final         Failed - Ca in normal range and within 180 days    Calcium  Date Value Ref Range Status  06/18/2019 9.5 8.6 - 10.2 mg/dL Final         Failed - Valid encounter within last 6 months    Recent Outpatient Visits          1 year ago Screening for endocrine, metabolic and immunity disorder   [facility] at [facility], Delfino Lovett, NP   2 years ago Annual physical exam   Primary Care at [facility], Gelene Mink, PA-C   3 years ago Annual physical exam   Primary Care at [facility], Gelene Mink, PA-C   3 years ago Subacute maxillary sinusitis   [facility] at [facility], Gelene Mink, PA-C   4 years ago Annual physical exam   Primary Care at [facility], [place], [place] - Patient is not pregnant      Passed - Last BP in normal range    BP Readings from Last 1 Encounters:  05/07/20 129/89

## 2020-08-26 ENCOUNTER — Other Ambulatory Visit: Payer: Self-pay | Admitting: Registered Nurse

## 2020-08-26 DIAGNOSIS — I10 Essential (primary) hypertension: Secondary | ICD-10-CM

## 2020-09-23 ENCOUNTER — Other Ambulatory Visit: Payer: Self-pay | Admitting: Registered Nurse

## 2020-09-23 DIAGNOSIS — I10 Essential (primary) hypertension: Secondary | ICD-10-CM

## 2020-12-10 ENCOUNTER — Other Ambulatory Visit: Payer: Self-pay

## 2020-12-10 ENCOUNTER — Ambulatory Visit (INDEPENDENT_AMBULATORY_CARE_PROVIDER_SITE_OTHER): Payer: Medicare HMO | Admitting: Registered Nurse

## 2020-12-10 ENCOUNTER — Encounter: Payer: Self-pay | Admitting: Registered Nurse

## 2020-12-10 VITALS — BP 135/87 | HR 92 | Temp 98.3°F | Resp 18 | Ht 67.0 in | Wt 158.0 lb

## 2020-12-10 DIAGNOSIS — I10 Essential (primary) hypertension: Secondary | ICD-10-CM

## 2020-12-10 DIAGNOSIS — Z0000 Encounter for general adult medical examination without abnormal findings: Secondary | ICD-10-CM | POA: Diagnosis not present

## 2020-12-10 DIAGNOSIS — Z125 Encounter for screening for malignant neoplasm of prostate: Secondary | ICD-10-CM

## 2020-12-10 DIAGNOSIS — Z130 Encounter for screening for diseases of the blood and blood-forming organs and certain disorders involving the immune mechanism: Secondary | ICD-10-CM

## 2020-12-10 DIAGNOSIS — Z13220 Encounter for screening for lipoid disorders: Secondary | ICD-10-CM | POA: Diagnosis not present

## 2020-12-10 DIAGNOSIS — Z1329 Encounter for screening for other suspected endocrine disorder: Secondary | ICD-10-CM | POA: Diagnosis not present

## 2020-12-10 DIAGNOSIS — Z13228 Encounter for screening for other metabolic disorders: Secondary | ICD-10-CM | POA: Diagnosis not present

## 2020-12-10 DIAGNOSIS — E782 Mixed hyperlipidemia: Secondary | ICD-10-CM

## 2020-12-10 LAB — CBC WITH DIFFERENTIAL/PLATELET
Basophils Absolute: 0 10*3/uL (ref 0.0–0.1)
Basophils Relative: 0.9 % (ref 0.0–3.0)
Eosinophils Absolute: 0 10*3/uL (ref 0.0–0.7)
Eosinophils Relative: 0.4 % (ref 0.0–5.0)
HCT: 44.9 % (ref 39.0–52.0)
Hemoglobin: 14.9 g/dL (ref 13.0–17.0)
Lymphocytes Relative: 24.2 % (ref 12.0–46.0)
Lymphs Abs: 1 10*3/uL (ref 0.7–4.0)
MCHC: 33.1 g/dL (ref 30.0–36.0)
MCV: 92.9 fl (ref 78.0–100.0)
Monocytes Absolute: 0.3 10*3/uL (ref 0.1–1.0)
Monocytes Relative: 7.1 % (ref 3.0–12.0)
Neutro Abs: 2.9 10*3/uL (ref 1.4–7.7)
Neutrophils Relative %: 67.4 % (ref 43.0–77.0)
Platelets: 277 10*3/uL (ref 150.0–400.0)
RBC: 4.84 Mil/uL (ref 4.22–5.81)
RDW: 13.2 % (ref 11.5–15.5)
WBC: 4.3 10*3/uL (ref 4.0–10.5)

## 2020-12-10 LAB — COMPREHENSIVE METABOLIC PANEL
ALT: 15 U/L (ref 0–53)
AST: 18 U/L (ref 0–37)
Albumin: 4.2 g/dL (ref 3.5–5.2)
Alkaline Phosphatase: 62 U/L (ref 39–117)
BUN: 9 mg/dL (ref 6–23)
CO2: 27 mEq/L (ref 19–32)
Calcium: 9.4 mg/dL (ref 8.4–10.5)
Chloride: 102 mEq/L (ref 96–112)
Creatinine, Ser: 0.92 mg/dL (ref 0.40–1.50)
GFR: 87.56 mL/min (ref >60.00)
Glucose, Bld: 79 mg/dL (ref 70–99)
Potassium: 3.9 mEq/L (ref 3.5–5.1)
Sodium: 138 mEq/L (ref 135–145)
Total Bilirubin: 0.6 mg/dL (ref 0.2–1.2)
Total Protein: 7.1 g/dL (ref 6.0–8.3)

## 2020-12-10 LAB — LIPID PANEL
Cholesterol: 216 mg/dL — ABNORMAL HIGH (ref 0–200)
HDL: 63.8 mg/dL (ref >39.00)
LDL Cholesterol: 137 mg/dL — ABNORMAL HIGH (ref 0–99)
NonHDL: 152.09
Total CHOL/HDL Ratio: 3
Triglycerides: 75 mg/dL (ref 0.0–149.0)
VLDL: 15 mg/dL (ref 0.0–40.0)

## 2020-12-10 LAB — PSA, MEDICARE: PSA: 0.71 ng/ml (ref 0.10–4.00)

## 2020-12-10 LAB — TSH: TSH: 1.45 u[IU]/mL (ref 0.35–5.50)

## 2020-12-10 LAB — HEMOGLOBIN A1C: Hgb A1c MFr Bld: 5.4 % (ref 4.6–6.5)

## 2020-12-10 MED ORDER — LISINOPRIL-HYDROCHLOROTHIAZIDE 10-12.5 MG PO TABS
1.0000 | ORAL_TABLET | Freq: Every day | ORAL | 3 refills | Status: DC
Start: 2020-12-10 — End: 2020-12-13

## 2020-12-10 NOTE — Progress Notes (Signed)
Established Patient Office Visit  Subjective:  Patient ID: Kenneth Maynard, male    DOB: July 26, 1955  Age: 65 y.o. MRN: [id number]  CC:  Chief Complaint  Patient presents with   Annual Exam    Patient states he is here for a CPE and medication refill.    HPI [name] presents for CPE  Hypertension: Patient Currently taking: lisinopril-hctz 10-12.'5mg'$  PO Qd Good effect. No AEs. Denies CV symptoms including: chest pain, shob, doe, headache, visual changes, fatigue, claudication, and dependent edema.   Previous readings and labs: BP Readings from Last 3 Encounters:  12/10/20 135/87  05/07/20 129/89  06/18/19 (!) 142/86   Lab Results  Component Value Date   CREATININE 0.96 06/18/2019     Otherwise he's doing well and without concern Histories reviewed and updated with patient.     Past Medical History:  Diagnosis Date   Allergy    Esophageal stricture    GIB (gastrointestinal bleeding)    Hypertension     Past Surgical History:  Procedure Laterality Date   BIOPSY  05/07/2020   Procedure: BIOPSY;  Surgeon: Otis Brace, MD;  Location: [facility];  Service: Gastroenterology;;   COLONOSCOPY WITH PROPOFOL N/A 02/19/2014   Procedure: COLONOSCOPY WITH PROPOFOL;  Surgeon: Garlan Fair, MD;  Location: [facility];  Service: Endoscopy;  Laterality: N/A;   ESOPHAGOGASTRODUODENOSCOPY  10/19/2011   Procedure: ESOPHAGOGASTRODUODENOSCOPY (EGD);  Surgeon: Garlan Fair, MD;  Location: [facility];  Service: Endoscopy;  Laterality: N/A;   ESOPHAGOGASTRODUODENOSCOPY N/A 08/17/2014   Procedure: ESOPHAGOGASTRODUODENOSCOPY (EGD);  Surgeon: Teena Irani, MD;  Location: [facility] ENDOSCOPY;  Service: Endoscopy;  Laterality: N/A;   ESOPHAGOGASTRODUODENOSCOPY N/A 08/19/2014   Procedure: ESOPHAGOGASTRODUODENOSCOPY (EGD);  Surgeon: Garlan Fair, MD;  Location: [facility] ENDOSCOPY;  Service: Endoscopy;  Laterality: N/A;   ESOPHAGOGASTRODUODENOSCOPY (EGD) WITH PROPOFOL N/A 02/24/2014    Procedure: ESOPHAGOGASTRODUODENOSCOPY (EGD) WITH PROPOFOL;  Surgeon: Jeryl Columbia, MD;  Location: [facility];  Service: Endoscopy;  Laterality: N/A;   ESOPHAGOGASTRODUODENOSCOPY (EGD) WITH PROPOFOL N/A 05/07/2020   Procedure: ESOPHAGOGASTRODUODENOSCOPY (EGD) WITH PROPOFOL w/DIL Under Fluroscopy;  Surgeon: Otis Brace, MD;  Location: [facility];  Service: Gastroenterology;  Laterality: N/A;   SAVORY DILATION  10/19/2011   Procedure: SAVORY DILATION;  Surgeon: Garlan Fair, MD;  Location: [facility];  Service: Endoscopy;  Laterality: N/A;  need carm   SAVORY DILATION N/A 02/24/2014   Procedure: SAVORY DILATION;  Surgeon: Jeryl Columbia, MD;  Location: [facility];  Service: Endoscopy;  Laterality: N/A;   SAVORY DILATION N/A 08/19/2014   Procedure: SAVORY DILATION;  Surgeon: Garlan Fair, MD;  Location: [facility];  Service: Endoscopy;  Laterality: N/A;   SAVORY DILATION N/A 05/07/2020   Procedure: SAVORY DILATION under Fluroscopy;  Surgeon: Otis Brace, MD;  Location: [facility];  Service: Gastroenterology;  Laterality: N/A;    Family History  Problem Relation Age of Onset   Colon cancer Father    Hypertension Father    Hyperlipidemia Father    Cancer Father 73       colon cancer   Cancer Mother        ovarian   Hypertension Sister    Hyperlipidemia Sister    Stroke Sister    Stroke Other 64    Social History   Socioeconomic History   Marital status: Married    Spouse name: Not on file   Number of children: 2   Years of education: Not on file   Highest education level: Not on file  Occupational History   Occupation: Management    Employer: [employer]  Tobacco Use   Smoking status: Never   Smokeless tobacco: Never  Vaping Use   Vaping Use: Never used  Substance and Sexual Activity   Alcohol use: Yes    Alcohol/week: 1.0 standard drink    Types: 1 Glasses of wine per week    Comment: 1-2 drinks rare use   Drug use: No    Sexual activity: Yes  Other Topics Concern   Not on file  Social History Narrative   Married. Education: [employer]. Exercise: Yes.   Social Determinants of Health   Financial Resource Strain: Not on file  Food Insecurity: Not on file  Transportation Needs: Not on file  Physical Activity: Not on file  Stress: Not on file  Social Connections: Not on file  Intimate Partner Violence: Not on file    Outpatient Medications Prior to Visit  Medication Sig Dispense Refill   cetirizine (ZYRTEC) 10 MG tablet Take 10 mg by mouth daily as needed for allergies or rhinitis.     fluticasone (FLONASE) 50 MCG/ACT nasal spray Place 2 sprays into both nostrils daily. 16 g 11   lisinopril-hydrochlorothiazide (ZESTORETIC) 10-12.5 MG tablet TAKE 1 TABLET BY MOUTH DAILY. TAKE 1 TABLET DAILY (OFFICE VISIT NEEDED) 30 tablet 0   atorvastatin (LIPITOR) 20 MG tablet Take 1 tablet (20 mg total) by mouth daily. (Patient not taking: No sig reported) 90 tablet 3   ibuprofen (ADVIL) 200 MG tablet Take 400 mg by mouth every 6 (six) hours as needed for mild pain.     No facility-administered medications prior to visit.    Allergies  Allergen Reactions   Grass Pollen(K-O-R-T-Swt Vern) Itching    Sneezing    ROS Review of Systems  Constitutional: Negative.   HENT: Negative.    Eyes: Negative.   Respiratory: Negative.    Cardiovascular: Negative.   Gastrointestinal: Negative.   Genitourinary: Negative.   Musculoskeletal: Negative.   Skin: Negative.   Neurological: Negative.   Psychiatric/Behavioral: Negative.    All other systems reviewed and are negative.    Objective:    Physical Exam Vitals and nursing note reviewed.  Constitutional:      General: He is not in acute distress.    Appearance: Normal appearance. He is normal weight. He is not ill-appearing, toxic-appearing or diaphoretic.  HENT:     Head: Normocephalic and atraumatic.     Right Ear: Tympanic membrane, ear canal and external ear  normal. There is no impacted cerumen.     Left Ear: Tympanic membrane, ear canal and external ear normal. There is no impacted cerumen.     Nose: Nose normal. No congestion or rhinorrhea.     Mouth/Throat:     Mouth: Mucous membranes are moist.     Pharynx: Oropharynx is clear. No oropharyngeal exudate or posterior oropharyngeal erythema.  Eyes:     General: No scleral icterus.       Right eye: No discharge.        Left eye: No discharge.     Extraocular Movements: Extraocular movements intact.     Conjunctiva/sclera: Conjunctivae normal.     Pupils: Pupils are equal, round, and reactive to light.  Neck:     Vascular: No carotid bruit.  Cardiovascular:     Rate and Rhythm: Normal rate and regular rhythm.     Pulses: Normal pulses.     Heart sounds: Normal heart sounds. No murmur heard.  No friction rub. No gallop.  Pulmonary:     Effort: Pulmonary effort is normal. No respiratory distress.     Breath sounds: Normal breath sounds. No stridor. No wheezing, rhonchi or rales.  Chest:     Chest wall: No tenderness.  Abdominal:     General: Abdomen is flat. Bowel sounds are normal. There is no distension.     Palpations: Abdomen is soft. There is no mass.     Tenderness: There is no abdominal tenderness. There is no right CVA tenderness, left CVA tenderness, guarding or rebound.     Hernia: No hernia is present.  Musculoskeletal:        General: No swelling, tenderness, deformity or signs of injury. Normal range of motion.     Cervical back: Normal range of motion and neck supple. No rigidity or tenderness.     Right lower leg: No edema.     Left lower leg: No edema.  Lymphadenopathy:     Cervical: No cervical adenopathy.  Skin:    General: Skin is warm and dry.     Capillary Refill: Capillary refill takes less than 2 seconds.     Coloration: Skin is not jaundiced or pale.     Findings: No bruising, erythema, lesion or rash.  Neurological:     General: No focal deficit present.      Mental Status: He is alert and oriented to person, place, and time. Mental status is at baseline.     Cranial Nerves: No cranial nerve deficit.     Motor: No weakness.     Gait: Gait normal.  Psychiatric:        Mood and Affect: Mood normal.        Behavior: Behavior normal.        Thought Content: Thought content normal.        Judgment: Judgment normal.    BP 135/87   Pulse 92   Temp 98.3 F (36.8 C) (Temporal)   Resp 18   Ht '5\' 7"'$  (1.702 m)   Wt 158 lb (71.7 kg)   SpO2 98%   BMI 24.75 kg/m  Wt Readings from Last 3 Encounters:  12/10/20 158 lb (71.7 kg)  05/07/20 154 lb (69.9 kg)  06/18/19 157 lb 9.6 oz (71.5 kg)     Health Maintenance Due  Topic Date Due   PNA vac Low Risk Adult (1 of 2 - PCV13) 11/12/2020    There are no preventive care reminders to display for this patient.  Lab Results  Component Value Date   TSH 1.090 06/18/2019   Lab Results  Component Value Date   WBC 4.5 06/18/2019   HGB 15.3 06/18/2019   HCT 45.5 06/18/2019   MCV 93 06/18/2019   PLT 322 06/18/2019   Lab Results  Component Value Date   NA 139 06/18/2019   K 4.3 06/18/2019   CO2 25 06/18/2019   GLUCOSE 77 06/18/2019   BUN 9 06/18/2019   CREATININE 0.96 06/18/2019   BILITOT 0.5 06/18/2019   ALKPHOS 74 06/18/2019   AST 18 06/18/2019   ALT 18 06/18/2019   PROT 7.2 06/18/2019   ALBUMIN 4.3 06/18/2019   CALCIUM 9.5 06/18/2019   ANIONGAP 5 08/19/2014   Lab Results  Component Value Date   CHOL 220 (H) 06/18/2019   Lab Results  Component Value Date   HDL 67 06/18/2019   Lab Results  Component Value Date   LDLCALC 141 (H) 06/18/2019   Lab Results  Component Value Date   TRIG 66 06/18/2019   Lab Results  Component Value Date   CHOLHDL 3.3 06/18/2019   Lab Results  Component Value Date   HGBA1C 5.3 06/18/2019      Assessment & Plan:   Problem List Items Addressed This Visit       Cardiovascular and Mediastinum   Essential hypertension   Relevant  Medications   lisinopril-hydrochlorothiazide (ZESTORETIC) 10-12.5 MG tablet   Other Relevant Orders   CBC with Differential/Platelet   Lipid panel   Comprehensive metabolic panel   Other Visit Diagnoses     Annual physical exam    -  Primary   Screening PSA (prostate specific antigen)       Relevant Orders   PSA, Medicare ( [place] only)   Screening for endocrine, metabolic and immunity disorder       Relevant Orders   CBC with Differential/Platelet   Hemoglobin A1c   TSH   Comprehensive metabolic panel   Lipid screening       Relevant Orders   Lipid panel   Mixed hyperlipidemia       Relevant Medications   lisinopril-hydrochlorothiazide (ZESTORETIC) 10-12.5 MG tablet   Other Relevant Orders   Lipid panel       Meds ordered this encounter  Medications   lisinopril-hydrochlorothiazide (ZESTORETIC) 10-12.5 MG tablet    Sig: Take 1 tablet by mouth daily.    Dispense:  90 tablet    Refill:  3    Follow-up: Return in about 1 year (around 12/10/2021) for CPE and labs.   PLAN Refill bp meds x 1 year.  Labs collected. Will follow up with the patient as warranted. Exam unremarkable Patient encouraged to call clinic with any questions, comments, or concerns.  Maximiano Coss, NP

## 2020-12-10 NOTE — Patient Instructions (Addendum)
Kenneth Maynard -   Always a pleasure.  Meds refilled x 1 year  Labs today should be back this afternoon. I'll call with any acute concerns.   See you next July unless you need anything sooner  Thank you  [name]     If you have lab work done today you will be contacted with your lab results within the next 2 weeks.  If you have not heard from [place] then please contact us. The fastest way to get your results is to register for My Chart.   IF you received an x-ray today, you will receive an invoice from [facility]. Please contact [facility] at [phone] with questions or concerns regarding your invoice.   IF you received labwork today, you will receive an invoice from [facility]. Please contact [facility] at [phone] with questions or concerns regarding your invoice.   Our billing staff will not be able to assist you with questions regarding bills from these companies.  You will be contacted with the lab results as soon as they are available. The fastest way to get your results is to activate your My Chart account. Instructions are located on the last page of this paperwork. If you have not heard from [place] regarding the results in 2 weeks, please contact this office.

## 2020-12-11 ENCOUNTER — Telehealth: Payer: Self-pay | Admitting: Registered Nurse

## 2020-12-11 DIAGNOSIS — I10 Essential (primary) hypertension: Secondary | ICD-10-CM

## 2020-12-13 ENCOUNTER — Other Ambulatory Visit: Payer: Self-pay

## 2020-12-13 DIAGNOSIS — I10 Essential (primary) hypertension: Secondary | ICD-10-CM

## 2020-12-13 MED ORDER — LISINOPRIL-HYDROCHLOROTHIAZIDE 10-12.5 MG PO TABS: 1.0000 | ORAL_TABLET | Freq: Every day | ORAL | 3 refills | Status: DC

## 2020-12-13 NOTE — Telephone Encounter (Signed)
Medication resent to pharmacy  

## 2020-12-13 NOTE — Telephone Encounter (Signed)
Pt is calling back about Medication that did not go through to pharmacy lisinopril-hydrochlorothiazide ([place]) 10-12.5 MG    [id number] [id number]  Pt call back [phone]

## 2021-06-13 ENCOUNTER — Other Ambulatory Visit: Payer: Self-pay | Admitting: Internal Medicine

## 2021-06-13 ENCOUNTER — Ambulatory Visit
Admission: RE | Admit: 2021-06-13 | Discharge: 2021-06-13 | Disposition: A | Discharge status: Home / Self Care | Payer: Medicare HMO | Source: Ambulatory Visit | Attending: Internal Medicine | Admitting: Internal Medicine

## 2021-06-13 DIAGNOSIS — M5489 Other dorsalgia: Secondary | ICD-10-CM

## 2021-06-13 DIAGNOSIS — I10 Essential (primary) hypertension: Secondary | ICD-10-CM | POA: Diagnosis not present

## 2021-06-13 DIAGNOSIS — K222 Esophageal obstruction: Secondary | ICD-10-CM | POA: Diagnosis not present

## 2021-06-13 DIAGNOSIS — M545 Low back pain, unspecified: Secondary | ICD-10-CM | POA: Diagnosis not present

## 2021-06-13 DIAGNOSIS — E7800 Pure hypercholesterolemia, unspecified: Secondary | ICD-10-CM | POA: Diagnosis not present

## 2021-10-06 DIAGNOSIS — Z800 Family history of malignant neoplasm of digestive organs: Secondary | ICD-10-CM | POA: Diagnosis not present

## 2021-10-06 DIAGNOSIS — R1319 Other dysphagia: Secondary | ICD-10-CM | POA: Diagnosis not present

## 2021-10-06 DIAGNOSIS — K222 Esophageal obstruction: Secondary | ICD-10-CM | POA: Diagnosis not present

## 2021-12-14 ENCOUNTER — Encounter: Payer: Self-pay | Admitting: Registered Nurse

## 2021-12-14 ENCOUNTER — Other Ambulatory Visit: Payer: Self-pay

## 2021-12-14 ENCOUNTER — Ambulatory Visit (INDEPENDENT_AMBULATORY_CARE_PROVIDER_SITE_OTHER): Payer: Medicare HMO | Admitting: Registered Nurse

## 2021-12-14 VITALS — BP 122/74 | HR 101 | Temp 98.1°F | Resp 18 | Ht 67.0 in | Wt 155.0 lb

## 2021-12-14 DIAGNOSIS — B9681 Helicobacter pylori [H. pylori] as the cause of diseases classified elsewhere: Secondary | ICD-10-CM | POA: Insufficient documentation

## 2021-12-14 DIAGNOSIS — I10 Essential (primary) hypertension: Secondary | ICD-10-CM

## 2021-12-14 DIAGNOSIS — J302 Other seasonal allergic rhinitis: Secondary | ICD-10-CM | POA: Diagnosis not present

## 2021-12-14 DIAGNOSIS — K219 Gastro-esophageal reflux disease without esophagitis: Secondary | ICD-10-CM | POA: Insufficient documentation

## 2021-12-14 DIAGNOSIS — Z800 Family history of malignant neoplasm of digestive organs: Secondary | ICD-10-CM | POA: Insufficient documentation

## 2021-12-14 DIAGNOSIS — K269 Duodenal ulcer, unspecified as acute or chronic, without hemorrhage or perforation: Secondary | ICD-10-CM | POA: Insufficient documentation

## 2021-12-14 DIAGNOSIS — Z0000 Encounter for general adult medical examination without abnormal findings: Secondary | ICD-10-CM

## 2021-12-14 DIAGNOSIS — E7800 Pure hypercholesterolemia, unspecified: Secondary | ICD-10-CM | POA: Insufficient documentation

## 2021-12-14 DIAGNOSIS — Z86010 Personal history of colonic polyps: Secondary | ICD-10-CM | POA: Insufficient documentation

## 2021-12-14 LAB — LIPID PANEL
Cholesterol: 226 mg/dL — ABNORMAL HIGH (ref 0–200)
HDL: 61.5 mg/dL (ref >39.00)
LDL Cholesterol: 151 mg/dL — ABNORMAL HIGH (ref 0–99)
NonHDL: 164.46
Total CHOL/HDL Ratio: 4
Triglycerides: 66 mg/dL (ref 0.0–149.0)
VLDL: 13.2 mg/dL (ref 0.0–40.0)

## 2021-12-14 LAB — COMPREHENSIVE METABOLIC PANEL
ALT: 10 U/L (ref 0–53)
AST: 15 U/L (ref 0–37)
Albumin: 4.2 g/dL (ref 3.5–5.2)
Alkaline Phosphatase: 61 U/L (ref 39–117)
BUN: 12 mg/dL (ref 6–23)
CO2: 28 mEq/L (ref 19–32)
Calcium: 9.3 mg/dL (ref 8.4–10.5)
Chloride: 103 mEq/L (ref 96–112)
Creatinine, Ser: 0.94 mg/dL (ref 0.40–1.50)
GFR: 84.72 mL/min (ref >60.00)
Glucose, Bld: 76 mg/dL (ref 70–99)
Potassium: 3.6 mEq/L (ref 3.5–5.1)
Sodium: 139 mEq/L (ref 135–145)
Total Bilirubin: 0.6 mg/dL (ref 0.2–1.2)
Total Protein: 7.5 g/dL (ref 6.0–8.3)

## 2021-12-14 LAB — CBC WITH DIFFERENTIAL/PLATELET
Basophils Absolute: 0 10*3/uL (ref 0.0–0.1)
Basophils Relative: 1 % (ref 0.0–3.0)
Eosinophils Absolute: 0 10*3/uL (ref 0.0–0.7)
Eosinophils Relative: 0.5 % (ref 0.0–5.0)
HCT: 42.7 % (ref 39.0–52.0)
Hemoglobin: 14.5 g/dL (ref 13.0–17.0)
Lymphocytes Relative: 26.2 % (ref 12.0–46.0)
Lymphs Abs: 1 10*3/uL (ref 0.7–4.0)
MCHC: 33.9 g/dL (ref 30.0–36.0)
MCV: 93.1 fl (ref 78.0–100.0)
Monocytes Absolute: 0.4 10*3/uL (ref 0.1–1.0)
Monocytes Relative: 9.1 % (ref 3.0–12.0)
Neutro Abs: 2.4 10*3/uL (ref 1.4–7.7)
Neutrophils Relative %: 63.2 % (ref 43.0–77.0)
Platelets: 258 10*3/uL (ref 150.0–400.0)
RBC: 4.59 Mil/uL (ref 4.22–5.81)
RDW: 12.9 % (ref 11.5–15.5)
WBC: 3.8 10*3/uL — ABNORMAL LOW (ref 4.0–10.5)

## 2021-12-14 LAB — TSH: TSH: 1.06 u[IU]/mL (ref 0.35–5.50)

## 2021-12-14 LAB — HEMOGLOBIN A1C: Hgb A1c MFr Bld: 5.4 % (ref 4.6–6.5)

## 2021-12-14 MED ORDER — LISINOPRIL-HYDROCHLOROTHIAZIDE 10-12.5 MG PO TABS
1.0000 | ORAL_TABLET | Freq: Every day | ORAL | 3 refills | Status: AC
Start: 2021-12-14 — End: ?

## 2021-12-14 MED ORDER — FLUTICASONE PROPIONATE 50 MCG/ACT NA SUSP
2.0000 | Freq: Every day | NASAL | 11 refills | Status: AC
Start: 2021-12-14 — End: ?

## 2021-12-14 NOTE — Patient Instructions (Addendum)
Kenneth Maynard -  Doristine Devoid to see you  You continue to be in great health. We'll check labs to confirm this.  Call if you need anything - otherwise, physical next year. I recommend these providers:  Inda Coke, PA Berniece Pap, MD Myrna Blazer Early, NP Jeralyn Ruths, DNP   Stay well, and thank you for letting me participate in your care!  [name]   If you have lab work done today you will be contacted with your lab results within the next 2 weeks.  If you have not heard from [place] then please contact us. The fastest way to get your results is to register for My Chart.   IF you received an x-ray today, you will receive an invoice from [facility]. Please contact [facility] at [phone] with questions or concerns regarding your invoice.   IF you received labwork today, you will receive an invoice from [place]. Please contact [facility] at [phone] with questions or concerns regarding your invoice.   Our billing staff will not be able to assist you with questions regarding bills from these companies.  You will be contacted with the lab results as soon as they are available. The fastest way to get your results is to activate your My Chart account. Instructions are located on the last page of this paperwork. If you have not heard from [place] regarding the results in 2 weeks, please contact this office.    .[email or web address]

## 2021-12-14 NOTE — Assessment & Plan Note (Signed)
Well controlled on current regimen. Continue.

## 2021-12-14 NOTE — Progress Notes (Signed)
Complete physical exam  Patient: Kenneth Maynard   DOB: 1955-09-27   66 y.o. Male  MRN: [id number] Visit Date: 12/14/2021  Subjective:    Chief Complaint  Patient presents with   Annual Exam    Patient states he is here for an annual CPE and discuss xray results    Kenneth Maynard is a 66 y.o. male who presents today for a complete physical exam. He reports consuming a general diet.     He generally feels well. He reports sleeping well. He does have additional problems to discuss today.   Vision:Within the last year Dental:Within Last 6 months STD Screen:No PSA:No  Hypertension: Patient Currently taking: lisinopril-hctz 10-12.'5mg'$  po qd.  Good effect. No AEs. Denies CV symptoms including: chest pain, shob, doe, headache, visual changes, fatigue, claudication, and dependent edema.   Previous readings and labs: BP Readings from Last 3 Encounters:  12/14/21 122/74  12/10/20 135/87  05/07/20 129/89   Lab Results  Component Value Date   CREATININE 0.92 12/10/2020     Most recent fall risk assessment:    12/14/2021    8:20 AM  Fall Risk   Falls in the past year? 0  Number falls in past yr: 0  Injury with Fall? 0  Risk for fall due to : No Fall Risks  Follow up Falls evaluation completed     Most recent depression screenings:    12/14/2021    8:21 AM 12/10/2020    8:50 AM  PHQ 2/9 Scores  PHQ - 2 Score 0 0  PHQ- 9 Score 0      Patient Active Problem List   Diagnosis Date Noted   Duodenal ulcer 12/14/2021   Family history of malignant neoplasm of digestive organs 12/14/2021   Gastroesophageal reflux disease 73/22/0254   Helicobacter pylori (H. pylori) as the cause of diseases classified elsewhere 12/14/2021   History of adenomatous polyp of colon 12/14/2021   Pure hypercholesterolemia 12/14/2021   Syncope 08/17/2014   GIB (gastrointestinal bleeding) 08/17/2014   Upper GI bleed    Acute blood loss anemia    Essential hypertension    HTN (hypertension)  12/11/2011   Benign esophageal stricture 05/22/1981   Past Medical History:  Diagnosis Date   Allergy    Esophageal stricture    GIB (gastrointestinal bleeding)    Hypertension    Past Surgical History:  Procedure Laterality Date   BIOPSY  05/07/2020   Procedure: BIOPSY;  Surgeon: Otis Brace, MD;  Location: [facility];  Service: Gastroenterology;;   COLONOSCOPY WITH PROPOFOL N/A 02/19/2014   Procedure: COLONOSCOPY WITH PROPOFOL;  Surgeon: Garlan Fair, MD;  Location: [facility];  Service: Endoscopy;  Laterality: N/A;   ESOPHAGOGASTRODUODENOSCOPY  10/19/2011   Procedure: ESOPHAGOGASTRODUODENOSCOPY (EGD);  Surgeon: Garlan Fair, MD;  Location: [facility];  Service: Endoscopy;  Laterality: N/A;   ESOPHAGOGASTRODUODENOSCOPY N/A 08/17/2014   Procedure: ESOPHAGOGASTRODUODENOSCOPY (EGD);  Surgeon: Teena Irani, MD;  Location: [facility]COPY;  Service: Endoscopy;  Laterality: N/A;   ESOPHAGOGASTRODUODENOSCOPY N/A 08/19/2014   Procedure: ESOPHAGOGASTRODUODENOSCOPY (EGD);  Surgeon: Garlan Fair, MD;  Location: [facility]OSCOPY;  Service: Endoscopy;  Laterality: N/A;   ESOPHAGOGASTRODUODENOSCOPY (EGD) WITH PROPOFOL N/A 02/24/2014   Procedure: ESOPHAGOGASTRODUODENOSCOPY (EGD) WITH PROPOFOL;  Surgeon: Jeryl Columbia, MD;  Location: [facility];  Service: Endoscopy;  Laterality: N/A;   ESOPHAGOGASTRODUODENOSCOPY (EGD) WITH PROPOFOL N/A 05/07/2020   Procedure: ESOPHAGOGASTRODUODENOSCOPY (EGD) WITH PROPOFOL w/DIL Under Fluroscopy;  Surgeon: Otis Brace, MD;  Location: [facility];  Service: Gastroenterology;  Laterality: N/A;   SAVORY DILATION  10/19/2011   Procedure: SAVORY DILATION;  Surgeon: Garlan Fair, MD;  Location: [facility];  Service: Endoscopy;  Laterality: N/A;  need carm   SAVORY DILATION N/A 02/24/2014   Procedure: SAVORY DILATION;  Surgeon: Jeryl Columbia, MD;  Location: [facility];  Service: Endoscopy;  Laterality: N/A;   SAVORY DILATION N/A 08/19/2014   Procedure:  SAVORY DILATION;  Surgeon: Garlan Fair, MD;  Location: [place];  Service: Endoscopy;  Laterality: N/A;   SAVORY DILATION N/A 05/07/2020   Procedure: SAVORY DILATION under Fluroscopy;  Surgeon: Otis Brace, MD;  Location: [facility];  Service: Gastroenterology;  Laterality: N/A;   Social History   Tobacco Use   Smoking status: Never   Smokeless tobacco: Never  Vaping Use   Vaping Use: Never used  Substance Use Topics   Alcohol use: Yes    Alcohol/week: 1.0 standard drink of alcohol    Types: 1 Glasses of wine per week    Comment: 1-2 drinks rare use   Drug use: No   Social History   Socioeconomic History   Marital status: Married    Spouse name: Not on file   Number of children: 2   Years of education: Not on file   Highest education level: Not on file  Occupational History   Occupation: Management    Employer: [employer]  Tobacco Use   Smoking status: Never   Smokeless tobacco: Never  Vaping Use   Vaping Use: Never used  Substance and Sexual Activity   Alcohol use: Yes    Alcohol/week: 1.0 standard drink of alcohol    Types: 1 Glasses of wine per week    Comment: 1-2 drinks rare use   Drug use: No   Sexual activity: Yes  Other Topics Concern   Not on file  Social History Narrative   Married. Education: [employer]. Exercise: Yes.   Social Determinants of Health   Financial Resource Strain: Not on file  Food Insecurity: Not on file  Transportation Needs: Not on file  Physical Activity: Not on file  Stress: Not on file  Social Connections: Not on file  Intimate Partner Violence: Not on file   Family Status  Relation Name Status   Father  Alive   Mother  Deceased   Sister  Alive   MGM  Deceased at age 48   MGF  Deceased at age 3   Kenneth Maynard  Deceased at age 73   PGF  Deceased at age 15   Other grandmother (Not Specified)   Family History  Problem Relation Age of Onset   Colon cancer Father    Hypertension Father     Hyperlipidemia Father    Cancer Father 34       colon cancer   Cancer Mother        ovarian   Hypertension Sister    Hyperlipidemia Sister    Stroke Sister    Stroke Other 104   Allergies  Allergen Reactions   Grass Pollen(K-O-R-T-Swt Vern) Itching    Sneezing   Statins      Patient Care Team: Maximiano Coss, NP as PCP - General (Adult Health Nurse Practitioner)   Medications: Outpatient Medications Prior to Visit  Medication Sig   cetirizine (ZYRTEC) 10 MG tablet Take 10 mg by mouth daily as needed for allergies or rhinitis.   [DISCONTINUED] fluticasone (FLONASE) 50 MCG/ACT nasal spray Place 2 sprays into both nostrils daily.   [DISCONTINUED]  lisinopril-hydrochlorothiazide (ZESTORETIC) 10-12.5 MG tablet Take 1 tablet by mouth daily.   levocetirizine (XYZAL ALLERGY 24HR) 5 MG tablet 1 tablet in the evening   No facility-administered medications prior to visit.    Review of Systems  Constitutional: Negative.   HENT: Negative.    Eyes: Negative.   Respiratory: Negative.    Cardiovascular: Negative.   Gastrointestinal: Negative.   Genitourinary: Negative.   Musculoskeletal: Negative.   Skin: Negative.   Neurological: Negative.   Psychiatric/Behavioral: Negative.    All other systems reviewed and are negative.   Last CBC Lab Results  Component Value Date   WBC 4.3 12/10/2020   HGB 14.9 12/10/2020   HCT 44.9 12/10/2020   MCV 92.9 12/10/2020   MCH 31.4 06/18/2019   RDW 13.2 12/10/2020   PLT 277.0 12/45/8099   Last metabolic panel Lab Results  Component Value Date   GLUCOSE 79 12/10/2020   NA 138 12/10/2020   K 3.9 12/10/2020   CL 102 12/10/2020   CO2 27 12/10/2020   BUN 9 12/10/2020   CREATININE 0.92 12/10/2020   GFRNONAA 84 06/18/2019   CALCIUM 9.4 12/10/2020   PROT 7.1 12/10/2020   ALBUMIN 4.2 12/10/2020   LABGLOB 2.9 06/18/2019   AGRATIO 1.5 06/18/2019   BILITOT 0.6 12/10/2020   ALKPHOS 62 12/10/2020   AST 18 12/10/2020   ALT 15 12/10/2020    ANIONGAP 5 08/19/2014   Last lipids Lab Results  Component Value Date   CHOL 216 (H) 12/10/2020   HDL 63.80 12/10/2020   LDLCALC 137 (H) 12/10/2020   TRIG 75.0 12/10/2020   CHOLHDL 3 12/10/2020   Last hemoglobin A1c Lab Results  Component Value Date   HGBA1C 5.4 12/10/2020   Last thyroid functions Lab Results  Component Value Date   TSH 1.45 12/10/2020   Last vitamin D No results found for: "25OHVITD2", "25OHVITD3", "VD25OH" Last vitamin B12 and Folate No results found for: "VITAMINB12", "FOLATE"      Objective:     BP 122/74   Pulse (!) 101   Temp 98.1 F (36.7 C) (Temporal)   Resp 18   Ht '5\' 7"'$  (1.702 m)   Wt 155 lb (70.3 kg)   SpO2 98%   BMI 24.28 kg/m   BP Readings from Last 3 Encounters:  12/14/21 122/74  12/10/20 135/87  05/07/20 129/89   Wt Readings from Last 3 Encounters:  12/14/21 155 lb (70.3 kg)  12/10/20 158 lb (71.7 kg)  05/07/20 154 lb (69.9 kg)   SpO2 Readings from Last 3 Encounters:  12/14/21 98%  12/10/20 98%  05/07/20 100%      Physical Exam Vitals and nursing note reviewed.  Constitutional:      General: He is not in acute distress.    Appearance: Normal appearance. He is not ill-appearing, toxic-appearing or diaphoretic.  HENT:     Head: Normocephalic and atraumatic.     Right Ear: Tympanic membrane, ear canal and external ear normal. There is no impacted cerumen.     Left Ear: Tympanic membrane, ear canal and external ear normal. There is no impacted cerumen.     Nose: Nose normal. No congestion or rhinorrhea.     Mouth/Throat:     Mouth: Mucous membranes are moist.     Pharynx: Oropharynx is clear. No oropharyngeal exudate or posterior oropharyngeal erythema.  Eyes:     General: No scleral icterus.       Right eye: No discharge.        Left  eye: No discharge.     Extraocular Movements: Extraocular movements intact.     Conjunctiva/sclera: Conjunctivae normal.     Pupils: Pupils are equal, round, and reactive to light.   Neck:     Vascular: No carotid bruit.  Cardiovascular:     Rate and Rhythm: Normal rate and regular rhythm.     Pulses: Normal pulses.     Heart sounds: Normal heart sounds. No murmur heard.    No friction rub. No gallop.  Pulmonary:     Effort: Pulmonary effort is normal. No respiratory distress.     Breath sounds: Normal breath sounds. No stridor. No wheezing, rhonchi or rales.  Chest:     Chest wall: No tenderness.  Abdominal:     General: Abdomen is flat. Bowel sounds are normal. There is no distension.     Palpations: Abdomen is soft. There is no mass.     Tenderness: There is no abdominal tenderness. There is no right CVA tenderness, left CVA tenderness, guarding or rebound.     Hernia: No hernia is present.  Musculoskeletal:        General: No swelling, tenderness, deformity or signs of injury. Normal range of motion.     Cervical back: Normal range of motion and neck supple. No rigidity or tenderness.     Right lower leg: No edema.     Left lower leg: No edema.  Lymphadenopathy:     Cervical: No cervical adenopathy.  Skin:    General: Skin is warm and dry.     Capillary Refill: Capillary refill takes less than 2 seconds.     Coloration: Skin is not jaundiced or pale.     Findings: No bruising, erythema, lesion or rash.  Neurological:     General: No focal deficit present.     Mental Status: He is alert and oriented to person, place, and time. Mental status is at baseline.     Cranial Nerves: No cranial nerve deficit.     Sensory: No sensory deficit.     Motor: No weakness.     Coordination: Coordination normal.     Gait: Gait normal.     Deep Tendon Reflexes: Reflexes normal.  Psychiatric:        Mood and Affect: Mood normal.        Behavior: Behavior normal.        Thought Content: Thought content normal.        Judgment: Judgment normal.      No results found for any visits on 12/14/21.    Assessment & Plan:    Routine Health Maintenance and Physical  Exam  Immunization History  Administered Date(s) Administered   Influenza Split 02/13/2014   Influenza Whole 03/05/2012   Influenza,inj,Quad PF,6+ Mos 05/09/2013, 05/09/2016, 05/18/2017   Influenza-Unspecified 02/13/2018   Tdap 04/26/2015    Health Maintenance  Topic Date Due   COVID-19 Vaccine (6 - Pfizer series) 12/30/2021 (Originally 07/17/2021)   Zoster Vaccines- Shingrix (1 of 2) 03/16/2022 (Originally 11/12/2005)   Pneumonia Vaccine 79+ Years old (1 - PCV) 12/15/2022 (Originally 11/12/2020)   INFLUENZA VACCINE  12/20/2021   COLONOSCOPY (Pts 45-68yr Insurance coverage will need to be confirmed)  04/07/2025   TETANUS/TDAP  04/25/2025   Hepatitis C Screening  Completed   HPV VACCINES  Aged Out    Discussed health benefits of physical activity, and encouraged him to engage in regular exercise appropriate for his age and condition.  Problem List Items Addressed This Visit  Cardiovascular and Mediastinum   Essential hypertension    Well controlled on current regimen. Continue.       Relevant Medications   lisinopril-hydrochlorothiazide (ZESTORETIC) 10-12.5 MG tablet   Other Relevant Orders   CBC with Differential/Platelet   Comprehensive metabolic panel   Hemoglobin A1c   Lipid panel   TSH   Other Visit Diagnoses     Annual physical exam    -  Primary   Seasonal allergies       Relevant Medications   fluticasone (FLONASE) 50 MCG/ACT nasal spray      Return in about 1 year (around 12/15/2022) for CPE and labs.     PLAN Exam unremarkable Labs collected. Will follow up with the patient as warranted. Patient encouraged to call clinic with any questions, comments, or concerns.   Maximiano Coss, NP

## 2022-04-10 DIAGNOSIS — K222 Esophageal obstruction: Secondary | ICD-10-CM | POA: Diagnosis not present

## 2022-04-10 DIAGNOSIS — R1310 Dysphagia, unspecified: Secondary | ICD-10-CM | POA: Diagnosis not present

## 2022-04-10 DIAGNOSIS — R638 Other symptoms and signs concerning food and fluid intake: Secondary | ICD-10-CM | POA: Diagnosis not present

## 2022-06-07 ENCOUNTER — Encounter (INDEPENDENT_AMBULATORY_CARE_PROVIDER_SITE_OTHER): Payer: Self-pay

## 2022-06-14 DIAGNOSIS — I10 Essential (primary) hypertension: Secondary | ICD-10-CM | POA: Diagnosis not present

## 2022-06-14 DIAGNOSIS — K449 Diaphragmatic hernia without obstruction or gangrene: Secondary | ICD-10-CM | POA: Diagnosis not present

## 2022-06-14 DIAGNOSIS — K222 Esophageal obstruction: Secondary | ICD-10-CM | POA: Diagnosis not present

## 2022-06-14 DIAGNOSIS — R1310 Dysphagia, unspecified: Secondary | ICD-10-CM | POA: Diagnosis not present

## 2022-08-15 DIAGNOSIS — Z79899 Other long term (current) drug therapy: Secondary | ICD-10-CM | POA: Diagnosis not present

## 2022-08-15 DIAGNOSIS — K449 Diaphragmatic hernia without obstruction or gangrene: Secondary | ICD-10-CM | POA: Diagnosis not present

## 2022-08-15 DIAGNOSIS — Z888 Allergy status to other drugs, medicaments and biological substances status: Secondary | ICD-10-CM | POA: Diagnosis not present

## 2022-08-15 DIAGNOSIS — I10 Essential (primary) hypertension: Secondary | ICD-10-CM | POA: Diagnosis not present

## 2022-08-15 DIAGNOSIS — J449 Chronic obstructive pulmonary disease, unspecified: Secondary | ICD-10-CM | POA: Diagnosis not present

## 2022-08-15 DIAGNOSIS — K222 Esophageal obstruction: Secondary | ICD-10-CM | POA: Diagnosis not present

## 2022-08-15 DIAGNOSIS — R1310 Dysphagia, unspecified: Secondary | ICD-10-CM | POA: Diagnosis not present

## 2022-10-18 DIAGNOSIS — I10 Essential (primary) hypertension: Secondary | ICD-10-CM | POA: Diagnosis not present

## 2022-10-18 DIAGNOSIS — Z79899 Other long term (current) drug therapy: Secondary | ICD-10-CM | POA: Diagnosis not present

## 2022-10-18 DIAGNOSIS — K2100 Gastro-esophageal reflux disease with esophagitis, without bleeding: Secondary | ICD-10-CM | POA: Diagnosis not present

## 2022-10-18 DIAGNOSIS — K222 Esophageal obstruction: Secondary | ICD-10-CM | POA: Diagnosis not present

## 2022-10-18 DIAGNOSIS — K449 Diaphragmatic hernia without obstruction or gangrene: Secondary | ICD-10-CM | POA: Diagnosis not present

## 2022-10-18 DIAGNOSIS — J309 Allergic rhinitis, unspecified: Secondary | ICD-10-CM | POA: Diagnosis not present

## 2022-10-23 DIAGNOSIS — K222 Esophageal obstruction: Secondary | ICD-10-CM | POA: Diagnosis not present

## 2022-10-23 DIAGNOSIS — R1310 Dysphagia, unspecified: Secondary | ICD-10-CM | POA: Diagnosis not present

## 2022-11-16 DIAGNOSIS — Z6824 Body mass index (BMI) 24.0-24.9, adult: Secondary | ICD-10-CM | POA: Diagnosis not present

## 2022-11-16 DIAGNOSIS — H10022 Other mucopurulent conjunctivitis, left eye: Secondary | ICD-10-CM | POA: Diagnosis not present

## 2022-12-19 DIAGNOSIS — Z125 Encounter for screening for malignant neoplasm of prostate: Secondary | ICD-10-CM | POA: Diagnosis not present

## 2022-12-19 DIAGNOSIS — Z5181 Encounter for therapeutic drug level monitoring: Secondary | ICD-10-CM | POA: Diagnosis not present

## 2022-12-19 DIAGNOSIS — K222 Esophageal obstruction: Secondary | ICD-10-CM | POA: Diagnosis not present

## 2022-12-19 DIAGNOSIS — E7800 Pure hypercholesterolemia, unspecified: Secondary | ICD-10-CM | POA: Diagnosis not present

## 2022-12-19 DIAGNOSIS — Z79899 Other long term (current) drug therapy: Secondary | ICD-10-CM | POA: Diagnosis not present

## 2022-12-19 DIAGNOSIS — Z23 Encounter for immunization: Secondary | ICD-10-CM | POA: Diagnosis not present

## 2022-12-19 DIAGNOSIS — I10 Essential (primary) hypertension: Secondary | ICD-10-CM | POA: Diagnosis not present

## 2022-12-19 DIAGNOSIS — Z0000 Encounter for general adult medical examination without abnormal findings: Secondary | ICD-10-CM | POA: Diagnosis not present

## 2022-12-19 DIAGNOSIS — M545 Low back pain, unspecified: Secondary | ICD-10-CM | POA: Diagnosis not present

## 2022-12-19 DIAGNOSIS — Z800 Family history of malignant neoplasm of digestive organs: Secondary | ICD-10-CM | POA: Diagnosis not present

## 2022-12-27 DIAGNOSIS — M545 Low back pain, unspecified: Secondary | ICD-10-CM | POA: Diagnosis not present

## 2023-01-18 DIAGNOSIS — M47896 Other spondylosis, lumbar region: Secondary | ICD-10-CM | POA: Diagnosis not present

## 2023-01-24 DIAGNOSIS — M47896 Other spondylosis, lumbar region: Secondary | ICD-10-CM | POA: Diagnosis not present

## 2023-01-31 DIAGNOSIS — M47896 Other spondylosis, lumbar region: Secondary | ICD-10-CM | POA: Diagnosis not present

## 2023-02-02 DIAGNOSIS — M47896 Other spondylosis, lumbar region: Secondary | ICD-10-CM | POA: Diagnosis not present

## 2023-02-05 DIAGNOSIS — M47896 Other spondylosis, lumbar region: Secondary | ICD-10-CM | POA: Diagnosis not present

## 2023-02-07 DIAGNOSIS — M47896 Other spondylosis, lumbar region: Secondary | ICD-10-CM | POA: Diagnosis not present

## 2023-02-12 DIAGNOSIS — M47896 Other spondylosis, lumbar region: Secondary | ICD-10-CM | POA: Diagnosis not present

## 2023-02-14 DIAGNOSIS — M47896 Other spondylosis, lumbar region: Secondary | ICD-10-CM | POA: Diagnosis not present

## 2023-02-21 DIAGNOSIS — M47896 Other spondylosis, lumbar region: Secondary | ICD-10-CM | POA: Diagnosis not present

## 2023-02-23 DIAGNOSIS — M47896 Other spondylosis, lumbar region: Secondary | ICD-10-CM | POA: Diagnosis not present

## 2023-02-28 DIAGNOSIS — M47896 Other spondylosis, lumbar region: Secondary | ICD-10-CM | POA: Diagnosis not present

## 2023-03-02 DIAGNOSIS — M47896 Other spondylosis, lumbar region: Secondary | ICD-10-CM | POA: Diagnosis not present

## 2023-03-08 DIAGNOSIS — H524 Presbyopia: Secondary | ICD-10-CM | POA: Diagnosis not present

## 2023-03-09 DIAGNOSIS — M47896 Other spondylosis, lumbar region: Secondary | ICD-10-CM | POA: Diagnosis not present

## 2023-03-15 DIAGNOSIS — M47896 Other spondylosis, lumbar region: Secondary | ICD-10-CM | POA: Diagnosis not present

## 2023-07-17 IMAGING — CR DG LUMBAR SPINE 2-3V
3 series · 3 of 3 positions shown · non-contrast
Comparison: None.

CLINICAL DATA: Chronic low back pain

EXAM:
LUMBAR SPINE - 2-3 VIEW

[t l-spine a.p.]
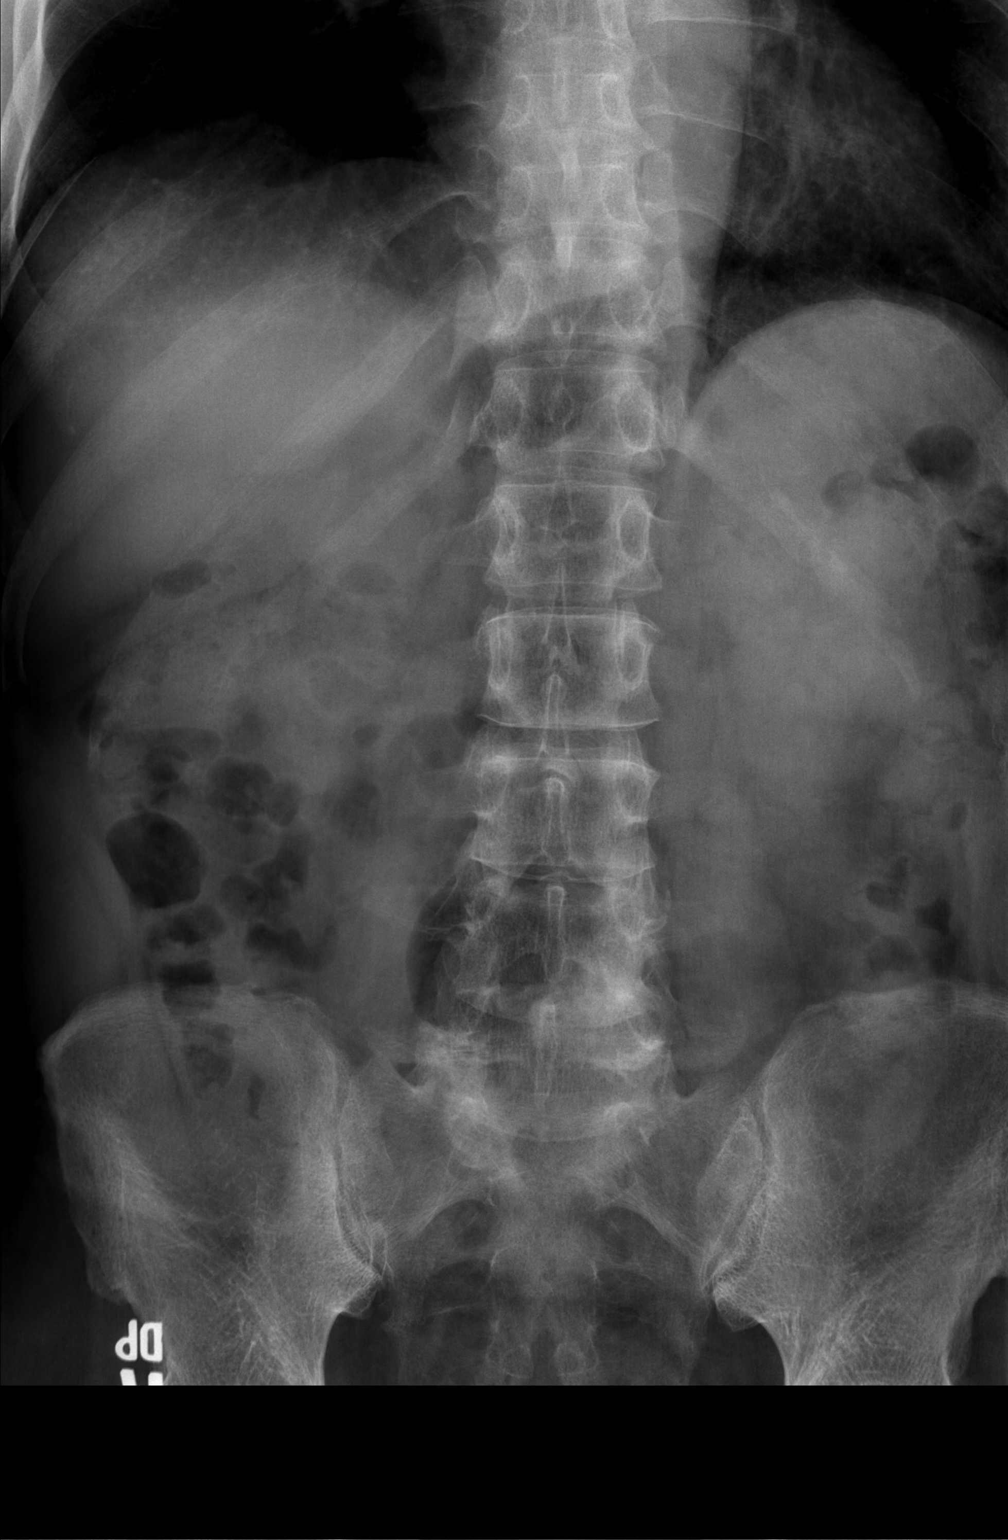

[t l-spine lat]
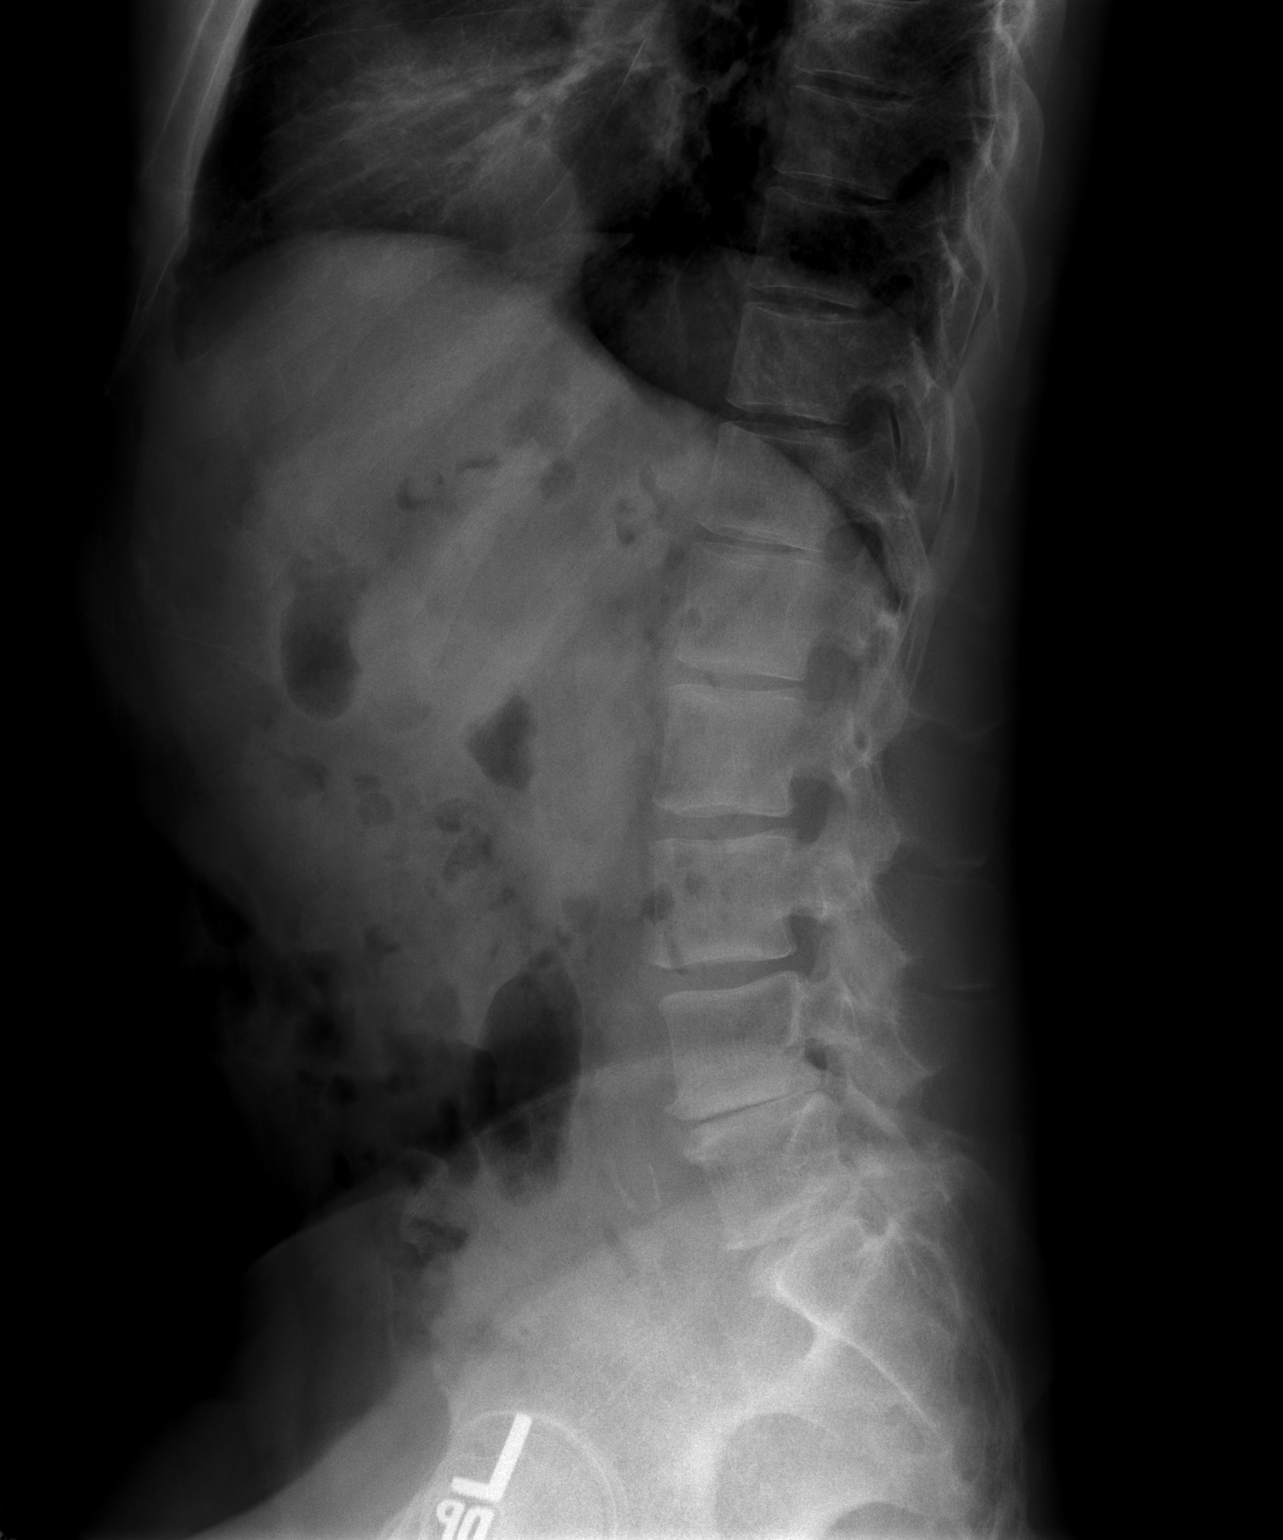

[t l-spine l5-s1 spot]
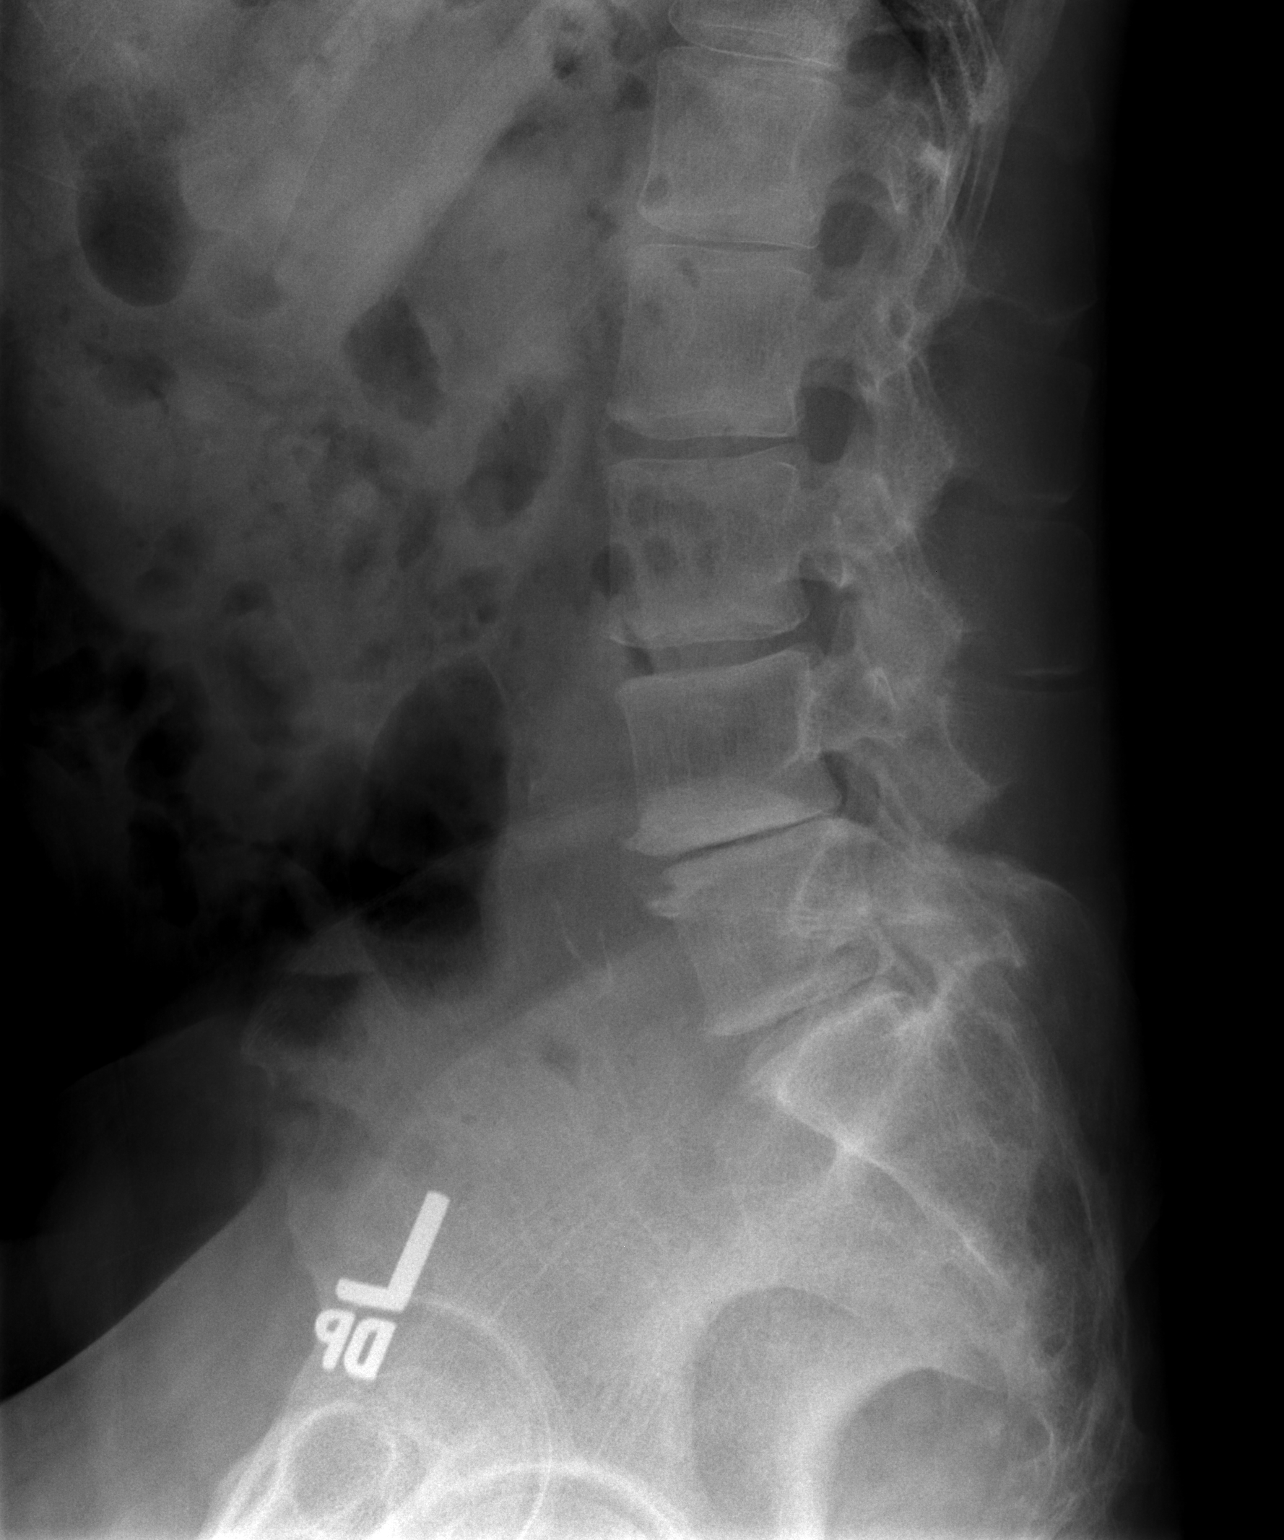

[3 of 3 positions shown; findings below may reference images not displayed]

FINDINGS: No recent fracture is seen. Alignment of posterior margins of
vertebral bodies is unremarkable. Degenerative changes are noted
with joint space narrowing, bony spurs and facet hypertrophy, more
severe at L4-L5 and L5-S1 levels.
IMPRESSION: No recent fracture is seen. Degenerative changes are noted in the
lumbar spine, particularly severe at L4-L5 and L5-S1 levels.

## 2024-03-03 DIAGNOSIS — I10 Essential (primary) hypertension: Secondary | ICD-10-CM | POA: Diagnosis not present

## 2024-03-03 DIAGNOSIS — Z125 Encounter for screening for malignant neoplasm of prostate: Secondary | ICD-10-CM | POA: Diagnosis not present

## 2024-03-03 DIAGNOSIS — Z5181 Encounter for therapeutic drug level monitoring: Secondary | ICD-10-CM | POA: Diagnosis not present

## 2024-03-03 DIAGNOSIS — E7800 Pure hypercholesterolemia, unspecified: Secondary | ICD-10-CM | POA: Diagnosis not present

## 2024-03-24 ENCOUNTER — Encounter: Payer: Self-pay | Admitting: Radiology

## 2024-03-25 ENCOUNTER — Ambulatory Visit (INDEPENDENT_AMBULATORY_CARE_PROVIDER_SITE_OTHER): Admitting: Orthopaedic Surgery

## 2024-03-25 ENCOUNTER — Other Ambulatory Visit: Payer: Self-pay

## 2024-03-25 DIAGNOSIS — M25511 Pain in right shoulder: Secondary | ICD-10-CM

## 2024-03-25 DIAGNOSIS — G8929 Other chronic pain: Secondary | ICD-10-CM

## 2024-03-25 MED ORDER — BUPIVACAINE HCL 0.5 % IJ SOLN
3.0000 mL | INTRAMUSCULAR | Status: AC | PRN
  Administered 2024-03-25: 3 mL via INTRA_ARTICULAR

## 2024-03-25 MED ORDER — METHYLPREDNISOLONE ACETATE 40 MG/ML IJ SUSP
40.0000 mg | INTRAMUSCULAR | Status: AC | PRN
  Administered 2024-03-25: 40 mg via INTRA_ARTICULAR

## 2024-03-25 MED ORDER — LIDOCAINE HCL 1 % IJ SOLN
3.0000 mL | INTRAMUSCULAR | Status: AC | PRN
  Administered 2024-03-25: 3 mL

## 2024-03-25 NOTE — Progress Notes (Signed)
 Office Visit Note   Patient: Kenneth Maynard           Date of Birth: 1956/02/06           MRN: [id number] Visit Date: 03/25/2024              Requested by: Rexanne Ingle, MD [address] [place],  [place] [address] PCP: Kip Ade, NP   Assessment & Plan: Visit Diagnoses:  1. Chronic right shoulder pain     Plan: History of Present Illness Kenneth Maynard is a 68 year old male who presents with right shoulder pain.  He has experienced right shoulder pain for several months, with a gradual worsening. The pain is sharp and persistent, primarily across the top of the shoulder. There is no recollection of any specific injury. The shoulder pain limits his daily activities.  Physical Exam MUSCULOSKELETAL: Right shoulder forward flexion to 160 degrees with pain, abduction to 90 degrees. External rotation to 85 degrees without pain. Positive Hawkins test with pain over the acromioclavicular joint. Positive empty can test with pain. Negative Speed's test with tenderness in the bicipital groove. Pain with infraspinatus testing.   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 X-ray shows AC joint arthritis and acromion bone spur. Rotator cuff intact with tendinosis. - Administered subacromial cortisone injection to reduce pain and inflammation. - Provided home exercises for rotator cuff strengthening. - Advised avoiding overhead lifting and exercises above shoulder level for six weeks. - Recommended avoiding gym exercises such as shoulder presses and lateral rows. - Encouraged core exercises and activities below the waist.  Follow-Up Instructions: No follow-ups on file.   Orders:  Orders Placed This Encounter  Procedures   Large Joint Inj: R subacromial bursa   XR Shoulder Right   No orders of the defined types were placed in this  encounter.     Procedures: Large Joint Inj: R subacromial bursa on 03/25/2024 9:36 AM Indications: pain Details: 22 G needle  Arthrogram: No  Medications: 3 mL lidocaine  1 %; 3 mL bupivacaine 0.5 %; 40 mg methylPREDNISolone acetate 40 MG/ML Outcome: tolerated well, no immediate complications Consent was given by the patient. Patient was prepped and draped in the usual sterile fashion.       Clinical Data: No additional findings.   Subjective: Chief Complaint  Patient presents with   Right Shoulder - Pain    HPI  Review of Systems  Constitutional: Negative.   HENT: Negative.    Eyes: Negative.   Respiratory: Negative.    Cardiovascular: Negative.   Gastrointestinal: Negative.   Endocrine: Negative.   Genitourinary: Negative.   Skin: Negative.   Allergic/Immunologic: Negative.   Neurological: Negative.   Hematological: Negative.   Psychiatric/Behavioral: Negative.    All other systems reviewed and are negative.    Objective: Vital Signs: There were no vitals taken for this visit.  Physical Exam Vitals and nursing note reviewed.  Constitutional:      Appearance: He is well-developed.  HENT:     Head: Normocephalic and atraumatic.  Eyes:     Pupils: Pupils are equal, round, and reactive to light.  Pulmonary:     Effort: Pulmonary effort is normal.  Abdominal:     Palpations: Abdomen is soft.  Musculoskeletal:        General: Normal range of motion.     Cervical back: Neck supple.  Skin:    General: Skin is warm.  Neurological:     Mental Status: He is alert and oriented to person, place, and time.  Psychiatric:        Behavior: Behavior normal.        Thought Content: Thought content normal.        Judgment: Judgment normal.     Ortho Exam  Specialty Comments:  No specialty comments available.  Imaging: XR Shoulder Right Result Date: 03/25/2024 X-rays of the right shoulder show a type II acromion.  No acute abnormalities.    PMFS  History: Patient Active Problem List   Diagnosis Date Noted   Duodenal ulcer 12/14/2021   Family history of malignant neoplasm of digestive organs 12/14/2021   Gastroesophageal reflux disease 12/14/2021   Helicobacter pylori (H. pylori) as the cause of diseases classified elsewhere 12/14/2021   History of adenomatous polyp of colon 12/14/2021   Pure hypercholesterolemia 12/14/2021   Syncope 08/17/2014   GIB (gastrointestinal bleeding) 08/17/2014   Upper GI bleed    Acute blood loss anemia    Essential hypertension    HTN (hypertension) 12/11/2011   Benign esophageal stricture 05/22/1981   Past Medical History:  Diagnosis Date   Allergy    Esophageal stricture    GIB (gastrointestinal bleeding)    Hypertension     Family History  Problem Relation Age of Onset   Colon cancer Father    Hypertension Father    Hyperlipidemia Father    Cancer Father 41       colon cancer   Cancer Mother        ovarian   Hypertension Sister    Hyperlipidemia Sister    Stroke Sister    Stroke Other 104    Past Surgical History:  Procedure Laterality Date   BIOPSY  05/07/2020   Procedure: BIOPSY;  Surgeon: Elicia Claw, MD;  Location: [facility];  Service: Gastroenterology;;   COLONOSCOPY WITH PROPOFOL  N/A 02/19/2014   Procedure: COLONOSCOPY WITH PROPOFOL ;  Surgeon: Gladis MARLA Louder, MD;  Location: [facility];  Service: Endoscopy;  Laterality: N/A;   ESOPHAGOGASTRODUODENOSCOPY  10/19/2011   Procedure: ESOPHAGOGASTRODUODENOSCOPY (EGD);  Surgeon: Gladis MARLA Louder, MD;  Location: [facility];  Service: Endoscopy;  Laterality: N/A;   ESOPHAGOGASTRODUODENOSCOPY N/A 08/17/2014   Procedure: ESOPHAGOGASTRODUODENOSCOPY (EGD);  Surgeon: Norleen Hint, MD;  Location: [facility]COPY;  Service: Endoscopy;  Laterality: N/A;   ESOPHAGOGASTRODUODENOSCOPY N/A 08/19/2014   Procedure: ESOPHAGOGASTRODUODENOSCOPY (EGD);  Surgeon: Gladis MARLA Louder, MD;  [facility];  Service: Endoscopy;  Laterality:  N/A;   ESOPHAGOGASTRODUODENOSCOPY (EGD) WITH PROPOFOL  N/A 02/24/2014   Procedure: ESOPHAGOGASTRODUODENOSCOPY (EGD) WITH PROPOFOL ;  Surgeon: Oliva FORBES Boots, MD;  Location: [facility];  Service: Endoscopy;  Laterality: N/A;   ESOPHAGOGASTRODUODENOSCOPY (EGD) WITH PROPOFOL  N/A 05/07/2020   Procedure: ESOPHAGOGASTRODUODENOSCOPY (EGD) WITH PROPOFOL  w/DIL Under Fluroscopy;  Surgeon: Elicia Claw, MD;  Location: [facility];  Service: Gastroenterology;  Laterality: N/A;   SAVORY DILATION  10/19/2011   Procedure: SAVORY DILATION;  Surgeon: Gladis MARLA Louder, MD;  Location: [facility];  Service: Endoscopy;  Laterality: N/A;  need carm   SAVORY DILATION N/A 02/24/2014   Procedure: SAVORY DILATION;  Surgeon: Oliva FORBES Boots, MD;  Location: [facility];  Service: Endoscopy;  Laterality: N/A;   SAVORY DILATION N/A 08/19/2014   Procedure: SAVORY DILATION;  Surgeon: Gladis MARLA Louder, MD;  Location: [facility] ENDOSCOPY;  Service: Endoscopy;  Laterality: N/A;   SAVORY DILATION N/A 05/07/2020   Procedure: SAVORY DILATION under Fluroscopy;  Surgeon:  Elicia Claw, MD;  Location: [facility];  Service: Gastroenterology;  Laterality: N/A;   Social History   Occupational History   Occupation: Management    Employer: [employer]  Tobacco Use   Smoking status: Never   Smokeless tobacco: Never  Vaping Use   Vaping status: Never Used  Substance and Sexual Activity   Alcohol use: Yes    Alcohol/week: 1.0 standard drink of alcohol    Types: 1 Glasses of wine per week    Comment: 1-2 drinks rare use   Drug use: No   Sexual activity: Yes
